# Patient Record
Sex: Male | Born: 1950 | Race: White | Hispanic: No | Marital: Married | Smoking: Former smoker
Health system: Southern US, Community
[De-identification: ages and names within clinical notes are randomized; demographics above are authoritative.]

## PROBLEM LIST (undated history)

## (undated) DIAGNOSIS — F419 Anxiety disorder, unspecified: Secondary | ICD-10-CM

## (undated) DIAGNOSIS — Z8546 Personal history of malignant neoplasm of prostate: Secondary | ICD-10-CM

## (undated) DIAGNOSIS — C801 Malignant (primary) neoplasm, unspecified: Secondary | ICD-10-CM

## (undated) DIAGNOSIS — I10 Essential (primary) hypertension: Secondary | ICD-10-CM

## (undated) DIAGNOSIS — E785 Hyperlipidemia, unspecified: Secondary | ICD-10-CM

## (undated) DIAGNOSIS — N529 Male erectile dysfunction, unspecified: Secondary | ICD-10-CM

## (undated) DIAGNOSIS — R0602 Shortness of breath: Secondary | ICD-10-CM

## (undated) HISTORY — DX: Essential (primary) hypertension: I10

## (undated) HISTORY — DX: Hyperlipidemia, unspecified: E78.5

## (undated) HISTORY — DX: Malignant (primary) neoplasm, unspecified: C80.1

## (undated) HISTORY — PX: OTHER SURGICAL HISTORY: SHX169

## (undated) HISTORY — DX: Male erectile dysfunction, unspecified: N52.9

## (undated) HISTORY — DX: Anxiety disorder, unspecified: F41.9

## (undated) HISTORY — DX: Personal history of malignant neoplasm of prostate: Z85.46

## (undated) HISTORY — PX: HERNIA REPAIR: SHX51

---
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
## 1999-06-08 ENCOUNTER — Encounter: Payer: Self-pay | Admitting: Urology

## 1999-06-08 ENCOUNTER — Ambulatory Visit (HOSPITAL_COMMUNITY): Admission: RE | Admit: 1999-06-08 | Discharge: 1999-06-08 | Payer: Self-pay | Admitting: Urology

## 2002-05-26 ENCOUNTER — Ambulatory Visit (HOSPITAL_COMMUNITY): Admission: RE | Admit: 2002-05-26 | Discharge: 2002-05-26 | Payer: Self-pay | Admitting: Cardiovascular Disease

## 2003-09-03 HISTORY — PX: PROSTATE SURGERY: SHX751

## 2003-11-07 ENCOUNTER — Inpatient Hospital Stay (HOSPITAL_COMMUNITY): Admission: RE | Admit: 2003-11-07 | Discharge: 2003-11-09 | Payer: Self-pay | Admitting: Urology

## 2003-11-07 ENCOUNTER — Encounter (INDEPENDENT_AMBULATORY_CARE_PROVIDER_SITE_OTHER): Payer: Self-pay | Admitting: Specialist

## 2004-02-15 ENCOUNTER — Encounter: Admission: RE | Admit: 2004-02-15 | Discharge: 2004-02-15 | Payer: Self-pay | Admitting: Cardiovascular Disease

## 2004-07-24 ENCOUNTER — Encounter: Admission: RE | Admit: 2004-07-24 | Discharge: 2004-07-24 | Payer: Self-pay | Admitting: Cardiovascular Disease

## 2005-10-03 ENCOUNTER — Ambulatory Visit (HOSPITAL_COMMUNITY): Admission: RE | Admit: 2005-10-03 | Discharge: 2005-10-03 | Payer: Self-pay | Admitting: General Surgery

## 2005-10-10 ENCOUNTER — Ambulatory Visit (HOSPITAL_COMMUNITY): Admission: RE | Admit: 2005-10-10 | Discharge: 2005-10-10 | Payer: Self-pay | Admitting: General Surgery

## 2007-08-11 ENCOUNTER — Ambulatory Visit (HOSPITAL_BASED_OUTPATIENT_CLINIC_OR_DEPARTMENT_OTHER): Admission: RE | Admit: 2007-08-11 | Discharge: 2007-08-11 | Payer: Self-pay | Admitting: Orthopedic Surgery

## 2008-04-20 ENCOUNTER — Ambulatory Visit (HOSPITAL_BASED_OUTPATIENT_CLINIC_OR_DEPARTMENT_OTHER): Admission: RE | Admit: 2008-04-20 | Discharge: 2008-04-20 | Payer: Self-pay | Admitting: Orthopedic Surgery

## 2009-08-21 ENCOUNTER — Encounter: Admission: RE | Admit: 2009-08-21 | Discharge: 2009-08-21 | Payer: Self-pay | Admitting: Cardiovascular Disease

## 2010-08-10 ENCOUNTER — Encounter
Admission: RE | Admit: 2010-08-10 | Discharge: 2010-08-10 | Payer: Self-pay | Source: Home / Self Care | Attending: Cardiovascular Disease | Admitting: Cardiovascular Disease

## 2010-09-22 ENCOUNTER — Encounter: Payer: Self-pay | Admitting: Cardiovascular Disease

## 2010-09-23 ENCOUNTER — Encounter: Payer: Self-pay | Admitting: Cardiovascular Disease

## 2011-01-15 NOTE — Op Note (Signed)
NAME:  James Lang, James Lang NO.:  [id number]   MEDICAL RECORD NO.:  [id number]          PATIENT TYPE:  AMB   LOCATION:  DSC                          FACILITY:  [facility]   PHYSICIAN:  Cindee Salt, M.D.       DATE OF BIRTH:  [birth year]   DATE OF PROCEDURE:  04/20/2008  DATE OF DISCHARGE:                               OPERATIVE REPORT   PREOPERATIVE DIAGNOSIS:  Carpal tunnel syndrome, right hand.   POSTOPERATIVE DIAGNOSIS:  Carpal tunnel syndrome, right hand.   OPERATION:  Decompression, right median nerve.   SURGEON:  Cindee Salt, MD   ASSISTANT:  Joaquin Courts, RN   ANESTHESIA:  Forearm based IV regional.   ANESTHESIOLOGIST:  Janetta Hora. Gelene Mink, MD   HISTORY:  The patient is a 60 year old male with a history of carpal  tunnel syndrome, EMG nerve conductions positive.  This has not responded  to conservative treatment.  He has undergone release of his left side.  He is admitted now for release of the right.  He is aware that there is  no guarantee with surgery, possibility of infection, recurrence injury  to arteries, nerves, tendons, incomplete relief of his symptoms, or  dystrophy.  In the preoperative area the patient is seen.  The extremity  marked by both the patient and surgeon.  Antibiotic given.  Questions  again encouraged and answered.   PROCEDURE:  The patient was brought to the operating room where a  forearm based IV regional anesthetic was carried out under the direction  of Dr. Gelene Mink.  He was prepped using DuraPrep, supine position, right  arm free.  A time-out was taken.  A longitudinal incision was made in  the palm and carried down through the subcutaneous tissue.  Bleeders  were electrocauterized.  Palmar fascia was split.  Superficial palmar  arch identified.  The flexor tendon to the ring little finger identified  to the ulnar side of the median nerve.  The carpal retinaculum was  incised with sharp dissection.  Right angle and Sewall  retractor were  placed between skin and forearm fascia.  Fascia was released for  approximately a centimeter and half proximal to the wrist crease under  direct vision.  Canal was explored.  The area of compression to the  nerve was apparent with an area of hyperemia.  No further lesions were  identified.  Tenosynovium tissue was moderately thickened.  The wound  was irrigated.  Skin closed with interrupted  5-0 Vicryl Rapide sutures.  Sterile compressive dressing and splint to  the wrist with the fingers free was applied.  The patient tolerated the  procedure well and was taken to the recovery room for observation in  satisfactory condition.  He will be discharged to home to return to the  [facility] [place] in 1 week on Talwin NX.           ______________________________  Cindee Salt, M.D.     GK/MEDQ  D:  04/20/2008  T:  04/21/2008  Job:  [id number]   cc:   Bevelyn Buckles. Nash Shearer, M.D.

## 2011-01-15 NOTE — Op Note (Signed)
NAME:  James Lang, James Lang NO.:  [id number]   MEDICAL RECORD NO.:  [id number]          PATIENT TYPE:  AMB   LOCATION:  DSC                          FACILITY:  [facility]   PHYSICIAN:  Cindee Salt, M.D.       DATE OF BIRTH:  05/30/1951   DATE OF PROCEDURE:  08/11/2007  DATE OF DISCHARGE:                               OPERATIVE REPORT   PREOPERATIVE DIAGNOSIS:  Carpal tunnel syndrome, left hand.   POSTOPERATIVE DIAGNOSIS:  Carpal tunnel syndrome, left hand.   PROCEDURE:  Decompression of left median nerve.   SURGEON:  Cindee Salt, M.D.   ASSISTANT:  R.N.   ANESTHESIA:  Forearm-based IV regional.   INDICATIONS FOR PROCEDURE:  The patient is a 60 year old male with a  history of carpal tunnel syndrome, EMG nerve conductions positive, which  has not responded to conservative treatment.  He has elected to proceed  to have this done.  He is aware of the risks and complications,  including infection, recurrence, injury to arteries, nerves, tendons,  incomplete relief of symptoms and dystrophy.  In the preoperative area,  the patient is seen, questions encouraged and answered.  Antibiotic  given.   DESCRIPTION OF PROCEDURE:  The patient was brought to the operating room  where a forearm-based IV regional anesthetic was carried out without  difficulty.  He was prepped using DuraPrep in the supine position with  the left arm free.   A longitudinal incision was made in the palm and carried down through  the subcutaneous tissue.  Bleeders were electrocauterized.  The palmar  fascia was split.  The superficial palmar arch was identified.  The  flexor tendon to the ring and little finger identified.  To the ulnar  side of the median nerve, the carpal retinaculum was incised with sharp  dissection.  Right-angle and Sewell retractor were placed between the  skin and forearm fascia and the fascia released for approximately 1.5 cm  proximal to the wrist crease under direct vision.   The area of  compression of the nerve was immediately apparent.  Right-angle and  Sewell retractor were placed between the skin and forearm fascia and the  fascia released for approximately 1.5 proximal to the wrist crease under  direct vision.  The canal was explored.  Again, no further lesions were  identified other than the area of compression to the nerve.  The wound  was irrigated.  The skin was closed with interrupted 4-0 Vicryl Rapide  suture, and a sterile compressive dressing and splint to the wrist  applied.   The patient tolerated the procedure well.  He was taken to the recovery  room for observation in satisfactory condition.  He will be discharged  home to return to the [facility] [place] in 1 week on Talwin Nx.           ______________________________  Cindee Salt, M.D.     GK/MEDQ  D:  08/11/2007  T:  08/11/2007  Job:  [id number]   cc:   Derenda Mis of [place]

## 2011-01-18 NOTE — Op Note (Signed)
NAME:  James Lang, James Lang NO.:  [id number]   MEDICAL RECORD NO.:  [id number]                   PATIENT TYPE:  INP   LOCATION:  X003                                 FACILITY:  [facility]   PHYSICIAN:  Maretta Bees. Vonita Moss, M.D.             DATE OF BIRTH:  [birth year]   DATE OF PROCEDURE:  11/07/2003  DATE OF DISCHARGE:                                 OPERATIVE REPORT   PREOPERATIVE DIAGNOSIS:  Prostatic carcinoma.   POSTOPERATIVE DIAGNOSIS:  Prostatic carcinoma.   PROCEDURE:  Radical retropubic prostatectomy and bilateral pelvic lymph node  dissection.   SURGEON:  Maretta Bees. Vonita Moss, M.D.   ASSISTANT:  Veverly Fells. Vernie Ammons, M.D.   ANESTHESIA:  General.   INDICATIONS:  This 60 year old white male with a PSA of 8.93 underwent a  prostate biopsy that showed Gleason grade 6 (3+3) in 50% of the tissue  biopsied on the right lobe, and the left lobe was benign.  He was counseled  about the therapeutic options as noted in my H&P, and he opted for surgery.   DESCRIPTION OF PROCEDURE:  The patient was brought to the operating room and  placed in the supine position, the lower abdomen and external genitalia  prepped and draped in the usual fashion.  A #24 French 30 mL Foley was  inserted without difficulty.  A lower midline vertical incision was made  below the umbilicus to the symphysis pubis and the rectus fascia and muscle  divided in the midline.  The pelvic retroperitoneal space was dissected out  quite easily.  The obturator nerves on each side were easily identified.  The right obturator lymph node packet was dissected out and hemostasis and  lymphostasis obtained with the use of black silk ties and Hemoloc clips.  Great vessels and obturator nerve were intact after removal of this packet  and also was similarly performed on the left obturator lymph node packet.  Both specimens were sent for permanent section.  The endopelvic fascia was  easily taken down  bilaterally.  It was quite delicate and easy to perforate.  The top of the prostate was made hemostatic with a 2-0 chromic catgut suture  ligature.  The puboprostatic ligaments were taken down bilaterally.  The  dorsal vein complex was easily identified and a McDougal clamp placed around  it and two heavy Vicryl ties placed on the dorsal vein complex.  The Stamey  retractor was used to isolate the dorsal vein complex and the urethra and  protect the apex of the prostate with a McDougal clamp behind the dorsal  vein complex.  It was divided and one small bleeder was oversewn with a 2-0  Vicryl suture in the dorsal vein complex.  A scissors was used to separate  the neurovascular bundle on each side of the urethra and the dorsal urethra  opened up with a knife blade and a right angle clamp placed around the back  of the urethra, which was then divided and the Foley catheter pulled up, and  the prostate was easily dissected off the rectum at this point.  The  prostatic pedicles were divided and controlled with Hemoloc clips  bilaterally.  The dorsal aspect of the seminal vesicles were easily  dissected out.  The anterior bladder neck was opened and there was a small  median lobe very close to the ureteral orifices, and the mucosa was scored  in the median lobe and the prostate dissected away from the bladder neck and  a nice plane between the bladder and the seminal vesicles was found and  developed.  The vas deferens on each side were isolated and divided after  placing on the Hemoloc clip, as were the seminal vesicles, and the specimen  was removed intact.  The bladder neck mucosa was reapproximated to the  surrounding bladder neck musculature using interrupted 4-0 chromic catgut.  The posterior bladder neck was closed in running fashion with a 2-0 chromic  catgut, taking precise care to protect the ureteral orifices, which were  closed to the bladder neck, but we were able to do this and  after this  closure noted indigo carmine-stained urine coming from each ureteral  orifice.  At this point there was a very nicely everted and well-defined  bladder neck.  A 20 French 5 mL Foley was placed per urethra and a #1  Prolene was tied through the tip of the Foley catheter, which would later be  brought out through the anterior wall of the bladder and through the  abdominal wall and sewed in a button at skin level at the end of the case.  The bladder neck and urethral anastomosis was performed by placing 2-0  Vicryl at 2, 5, 7, 10, and 12 o'clock and the sutures were tied down,  creating a nice watertight anastomosis with blue-tinged irrigation with an  Asepto syringe.  A stab wound was made in the left __________ incision,  through which a large flat Blake drain was placed in the prevesical space.  At this point the wound was irrigated with an antibiotic solution.  The  rectus muscle was reapproximated with running 0 chromic catgut.  The rectus  fascia was closed with running #1 PDS.  The subcu was irrigated and the skin  closed with skin staples.  The Blake drain was connected to closed bulb  drainage and sewn in place with a black silk.  The wound was cleaned with  dry sterile gauze dressings.  Sponge, needle, instrument count was correct.  Estimated blood loss was 400 mL.  He was taken to the recovery room in good  condition, having tolerated the procedure well.                                               Maretta Bees. Vonita Moss, M.D.    LJP/MEDQ  D:  11/07/2003  T:  11/07/2003  Job:  [id number]   cc:   Ricki Rodriguez, M.D.  [address]  [place] [address]

## 2011-01-18 NOTE — H&P (Signed)
NAME:  James Lang, LAUNER NO.:  [id number]   MEDICAL RECORD NO.:  [id number]                   PATIENT TYPE:  INP   LOCATION:  X003                                 FACILITY:  [facility]   PHYSICIAN:  Maretta Bees. Vonita Moss, M.D.             DATE OF BIRTH:  03/11/1951   DATE OF ADMISSION:  11/07/2003  DATE OF DISCHARGE:                                HISTORY & PHYSICAL   This 60 year old white male was seen by me on August 23, 2003, with a PSA  of 8.93 obtained on routine examination. He had some mild bladder outlet  obstructive symptoms, but nothing that required therapy. He had no dysuria,  hematuria, or history of prostatitis. Repeat PSA was still elevated, so he  had a prostate biopsy performed on September 20 2003, that showed Gleason 6  (3+3) carcinoma involving 50% of the right lobe and some focal atrophy, but  no malignancy seen on the left side.  He and his wife were counseled about  therapeutic options and he elected to have radical retropubic prostatectomy  with the known risks of impotence, incontinence, hemorrhage, rectal injury,  and the usual medical complications that are fortunately not common. He is  admitted today for surgery.   PAST MEDICAL HISTORY:  1. History of hypertension.  2. Hypercholesterolemia.  3. COPD.  4. Anxiety.   MEDICATIONS:  1. Coreg 3.125 mg b.i.d.  2. Sular 10 mg each morning.  3. Lipitor 10 mg at bedtime.  4. Alprazolam 0.25 mg p.r.n. h.s.  5. Multivitamins.  6. Advil p.r.n. which she has not taken for a week.   ALLERGIES:  Cannot tolerate CODEINE due to nausea and vomiting.   PAST SURGICAL HISTORY:  Includes only surgery on the right foot.   SOCIAL HISTORY:  He quit smoking in 2003 and does not drink alcohol.   FAMILY HISTORY:  Negative for prostate cancer.   REVIEW OF SYSTEMS:  As noted on the health history form.   PHYSICAL EXAMINATION:  VITAL SIGNS: Blood pressure 148/82, pulse 60, and  temperature 97.  GENERAL: He is alert and oriented.  SKIN: Warm and dry.  NECK: Supple.  CHEST: Clear.  HEART: A faint systolic murmur.  ABDOMEN: Soft and nontender.  GENITALIA: External genitalia unremarkable. Prostate feels benign and  smooth.   IMPRESSION:  1. Prostatic carcinoma.  2. Chronic obstructive pulmonary disease.  3. Hypertension.  4. Hypercholesterolemia.  5. Chronic anxiety.                                               Maretta Bees. Vonita Moss, M.D.    LJP/MEDQ  D:  11/07/2003  T:  11/07/2003  Job:  [id number]   cc:   Ricki Rodriguez, M.D.  [address]  [place] [address]

## 2011-01-18 NOTE — Discharge Summary (Signed)
NAME:  James Lang, James Lang NO.:  [id number]   MEDICAL RECORD NO.:  [id number]                   PATIENT TYPE:  INP   LOCATION:  0343                                 FACILITY:  [facility]   PHYSICIAN:  Maretta Bees. Vonita Moss, M.D.             DATE OF BIRTH:  06-Feb-1951   DATE OF ADMISSION:  11/07/2003  DATE OF DISCHARGE:  11/09/2003                                 DISCHARGE SUMMARY   FINAL DIAGNOSES:  1. Prosthetic carcinoma.  2. Hypertension.  3. Hypercholesterolemia.  4. Chronic obstructive pulmonary disease.  5. Chronic anxiety.   PROCEDURE:  Radical retropubic prostatectomy and bilateral pelvic lymph node  dissections November 07, 2003.   HISTORY OF PRESENT ILLNESS:  This 60 year old white male was found to have a  PSA of 8.93 on routine examination and prostate biopsy demonstrated Gleason  grade 6 carcinoma involving 50% of the right lobe and some focal atrophy but  no malignancy on the left side. He was counseled about therapies and opted  for surgery. He was cleared for surgery by Dr. Algie Coffer.   PHYSICAL EXAMINATION:  Noncontributory.   HOSPITAL COURSE:  After admission he underwent radical retropubic  prostatectomy and bilateral pelvic lymph node dissection. His postoperative  course was quite benign and he never developed any significant fever or any  heavy wound drainage. He had good urinary output. He passed flatus and was  eating solid food by the morning of discharge. His pain was under good  control and he was ambulating well. His Blake drain was removed that  morning. He was sent home on limited activity and his Foley catheter will be  connected to a leg bag during the day and a drainage bag at night.   FOLLOW UP:  Return to the office next week for followup. Final pathology is  pending.   CONDITION ON DISCHARGE:  Good.   DISCHARGE MEDICATIONS:  He was sent home on Vicodin for pain. May continue  his usual medications of Coreg 3.125 mg b.i.d.,  Sular 10 mg each morning,  Lipitor 10 mg at bedtime, Alprazolam 0.25 mg at bedtime p.r.n. and  multivitamins.                                               Maretta Bees. Vonita Moss, M.D.    LJP/MEDQ  D:  11/09/2003  T:  11/10/2003  Job:  [id number]

## 2011-01-18 NOTE — Op Note (Signed)
NAME:  James Lang, James Lang NO.:  [id number]   MEDICAL RECORD NO.:  [id number]          PATIENT TYPE:  AMB   LOCATION:  DAY                          FACILITY:  [facility]   PHYSICIAN:  Ollen Gross. Vernell Morgans, M.D. DATE OF BIRTH:  1951/04/19   DATE OF PROCEDURE:  10/10/2005  DATE OF DISCHARGE:                                 OPERATIVE REPORT   PREOPERATIVE DIAGNOSIS:  Anal fistula.   POSTOP DIAGNOSIS:  Anal fistula.   PROCEDURE:  Exam under anesthesia and anal fistulotomy.   SURGEON:  Ollen Gross. Carolynne Edouard, M.D.   ANESTHESIA:  General via LMA.   DESCRIPTION OF PROCEDURE:  After informed consent was obtained, the patient  was brought to the operating room and placed in the supine position on the  operating room table. After induction of general anesthesia, the patient was  placed in the lithotomy position and his perirectal area was prepped with  Betadine and draped in the usual sterile fashion. The perirectal region was  then infiltrated 1/4% Marcaine with epinephrine. A finger was then inserted  into the rectum and no masses were appreciable.   Next, a bullet retractor was placed inside the rectum posteriorly. There was  noted to be some pus coming from the skin as well as coming from an opening  near the dentate line. This tract was probed with a small silver probe; and  the two areas communicated easily. This area was noted to be shallow and  superficial to the sphincter muscles. It was then opened sharply with the  electrocautery; and the granulation tissue in the fistula tract was  fulgurated, also with electrocautery. No other abnormalities were noted. The  area was then dressed with Dibucaine ointment and sterile dressings. The  patient tolerated the procedure well. At that end of the case, all needle,  sponge, and instrument counts were correct. The patient was then awakened,  taken to the recovery room in stable condition.      Ollen Gross. Vernell Morgans, M.D.  Electronically  Signed     PST/MEDQ  D:  10/10/2005  T:  10/11/2005  Job:  [id number]

## 2011-06-10 LAB — POCT HEMOGLOBIN-HEMACUE
Hemoglobin: 15.8
Operator id: 128471

## 2011-06-10 LAB — BASIC METABOLIC PANEL
CO2: 29
Calcium: 9.1
Chloride: 104
GFR calc non Af Amer: >60
Glucose, Bld: 107 — ABNORMAL HIGH
Potassium: 4.8
Sodium: 139

## 2011-11-17 ENCOUNTER — Ambulatory Visit (INDEPENDENT_AMBULATORY_CARE_PROVIDER_SITE_OTHER): Payer: 59 | Admitting: Family Medicine

## 2011-11-17 VITALS — BP 107/71 | HR 51 | Temp 98.2°F | Resp 16 | Ht 71.5 in | Wt 242.0 lb

## 2011-11-17 DIAGNOSIS — J189 Pneumonia, unspecified organism: Secondary | ICD-10-CM

## 2011-11-17 DIAGNOSIS — I10 Essential (primary) hypertension: Secondary | ICD-10-CM

## 2011-11-17 MED ORDER — CEFTRIAXONE SODIUM 1 G IJ SOLR
1.0000 g | INTRAMUSCULAR | Status: DC
  Administered 2011-11-17: 1 g via INTRAMUSCULAR

## 2011-11-17 MED ORDER — LEVOFLOXACIN 500 MG PO TABS: 500.0000 mg | ORAL_TABLET | Freq: Every day | ORAL | Status: AC

## 2011-11-17 MED ORDER — HYDROCODONE-HOMATROPINE 5-1.5 MG/5ML PO SYRP: 5.0000 mL | ORAL_SOLUTION | Freq: Three times a day (TID) | ORAL | Status: DC | PRN

## 2011-11-17 NOTE — Patient Instructions (Signed)

## 2011-11-17 NOTE — Progress Notes (Signed)
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  Ex-smoker (quit 8 years ago).  Occas productive cough. He is accompanied by his wife today. He notes that he gets a cough requiring medical attention at lease once a year.  O:  Acute distress, friendly and cooperative Skin warm and dry HEENT unremarkable Chest right sided rales, no resp difficulty  A:  Pneumonitis, acute, progressive.  No significant resp compromise now  P:  Levaquin 500 mg qd x 7 Hydromet

## 2011-11-19 ENCOUNTER — Telehealth: Payer: Self-pay

## 2011-11-19 NOTE — Telephone Encounter (Signed)
Discussed with Dr. Milus Glazier.  He asks that the patient come in to see him tomorrow for re-evaluation.

## 2011-11-19 NOTE — Telephone Encounter (Signed)
.  [facility]     PT TOLD TO CALL BACK IF SYMPTOMS DID NOT IMPROVE,HE DOES NOT FEEL MUCH BETTER,PLEASE ADVISE.   BEST PHONE  [phone]  [facility] [address]

## 2011-11-19 NOTE — Telephone Encounter (Signed)
Spoke to patient's wife. Patient was already in bed. She will have him here tomorrow.

## 2011-11-19 NOTE — Telephone Encounter (Signed)
Dr. Milus Glazier, would you like the patient to return, or did you have a plan to call in additional treatment?

## 2011-11-19 NOTE — Telephone Encounter (Signed)
Spoke to pt, still has cough.  Keeping H/A due to cough.  Cough has not improved, but pt did state cough med is working.  Pt was told to c/b in 3 days if not better.  Please avise.

## 2011-11-20 ENCOUNTER — Ambulatory Visit: Payer: 59

## 2011-11-20 ENCOUNTER — Ambulatory Visit (INDEPENDENT_AMBULATORY_CARE_PROVIDER_SITE_OTHER): Payer: 59 | Admitting: Family Medicine

## 2011-11-20 VITALS — BP 125/77 | HR 62 | Temp 98.5°F | Resp 18 | Ht 71.0 in | Wt 239.6 lb

## 2011-11-20 DIAGNOSIS — R062 Wheezing: Secondary | ICD-10-CM

## 2011-11-20 DIAGNOSIS — R059 Cough, unspecified: Secondary | ICD-10-CM

## 2011-11-20 DIAGNOSIS — R05 Cough: Secondary | ICD-10-CM

## 2011-11-20 LAB — POCT CBC
Granulocyte percent: 67.2 %G (ref 37–80)
HCT, POC: 47.8 % (ref 43.5–53.7)
Hemoglobin: 15.6 g/dL (ref 14.1–18.1)
Lymph, poc: 1.8 (ref 0.6–3.4)
MCH, POC: 29.2 pg (ref 27–31.2)
MCHC: 32.6 g/dL (ref 31.8–35.4)
MCV: 89.3 fL (ref 80–97)
MID (cbc): 0.7 (ref 0–0.9)
MPV: 9.6 fL (ref 0–99.8)
POC Granulocyte: 5 (ref 2–6.9)
POC LYMPH PERCENT: 23.8 %L (ref 10–50)
POC MID %: 9 %M (ref 0–12)
Platelet Count, POC: 340 10*3/uL (ref 142–424)
RBC: 5.35 M/uL (ref 4.69–6.13)
RDW, POC: 13.1 %
WBC: 7.5 10*3/uL (ref 4.6–10.2)

## 2011-11-20 MED ORDER — ALBUTEROL SULFATE (2.5 MG/3ML) 0.083% IN NEBU
2.5000 mg | INHALATION_SOLUTION | Freq: Once | RESPIRATORY_TRACT | Status: AC
  Administered 2011-11-20: 2.5 mg via RESPIRATORY_TRACT

## 2011-11-20 MED ORDER — METHYLPREDNISOLONE 4 MG PO KIT: PACK | ORAL | Status: AC

## 2011-11-20 MED ORDER — MOMETASONE FURO-FORMOTEROL FUM 200-5 MCG/ACT IN AERO: 2.0000 | INHALATION_SPRAY | Freq: Two times a day (BID) | RESPIRATORY_TRACT | Status: DC

## 2011-11-20 NOTE — Progress Notes (Signed)
  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  He has a h/o bronchitis.  Felt better at first this morning but then the congestion and wheezing began.  No dyspnea on exertion.  Cough is minimally productive.    O:  HEENT: nasal congestion, otherwise neg NAD, but has audible wheezing Chest:  Bibasilar rales and diffuse wheezes. Neck: some fullness, no JVD Ext: no significant edema Improves after inhaler  [facility] reading (PRIMARY) by  Dr. Milus Glazier: CXR. No infiltrate or suspicious nodules  A: severe bronchitis  P:  Pt to let me know if sx do not clear in 48 hours. Medrol dospak dulera 200 two puffs bid.

## 2011-11-22 ENCOUNTER — Telehealth: Payer: Self-pay

## 2011-11-22 NOTE — Telephone Encounter (Signed)
PT STATES HE WAS SEEN BY DR Kenyon Ana AND ISN'T GETTING ANY BETTER. PLEASE CALL [phone]   [facility] [address]

## 2011-11-24 MED ORDER — HYDROCODONE-HOMATROPINE 5-1.5 MG/5ML PO SYRP: 5.0000 mL | ORAL_SOLUTION | Freq: Three times a day (TID) | ORAL | Status: AC | PRN

## 2011-11-24 NOTE — Telephone Encounter (Signed)
Advised pt that rx sent to pharmacy 

## 2011-11-24 NOTE — Telephone Encounter (Signed)
Dr. Milus Glazier your note said for the pt to let you know if he was not better but there were no instructions for me, so I am sending to you.

## 2011-11-24 NOTE — Telephone Encounter (Signed)
Please refill cough med. thx

## 2011-11-24 NOTE — Telephone Encounter (Signed)
PT STATES THAT HE IS ONLY A LITTLE BETTER AND IS STILL COUGHING.  HE HAS FINISHED THE COUGH MEDICINE.  PT STATES IN THE PAST WHEN HE HAD PNEUMONIA HE WAS GIVEN A ZPAK. CAN WE GET HIM SOME MORE COUGH MED? ZPAK? PLEASE ADVISE

## 2012-01-30 ENCOUNTER — Other Ambulatory Visit: Payer: Self-pay | Admitting: Cardiovascular Disease

## 2012-01-30 ENCOUNTER — Ambulatory Visit
Admission: RE | Admit: 2012-01-30 | Discharge: 2012-01-30 | Disposition: A | Discharge status: Home / Self Care | Payer: 59 | Source: Ambulatory Visit | Attending: Cardiovascular Disease | Admitting: Cardiovascular Disease

## 2012-01-30 DIAGNOSIS — R52 Pain, unspecified: Secondary | ICD-10-CM

## 2013-01-29 ENCOUNTER — Other Ambulatory Visit: Payer: Self-pay | Admitting: Gastroenterology

## 2013-03-03 ENCOUNTER — Encounter (INDEPENDENT_AMBULATORY_CARE_PROVIDER_SITE_OTHER): Payer: Self-pay | Admitting: Surgery

## 2013-03-08 ENCOUNTER — Ambulatory Visit (INDEPENDENT_AMBULATORY_CARE_PROVIDER_SITE_OTHER): Payer: BC Managed Care – PPO | Admitting: Surgery

## 2013-03-08 ENCOUNTER — Encounter (INDEPENDENT_AMBULATORY_CARE_PROVIDER_SITE_OTHER): Payer: Self-pay | Admitting: Surgery

## 2013-03-08 VITALS — BP 128/84 | HR 69 | Temp 97.2°F | Resp 18 | Ht 73.5 in | Wt 234.6 lb

## 2013-03-08 DIAGNOSIS — K4090 Unilateral inguinal hernia, without obstruction or gangrene, not specified as recurrent: Secondary | ICD-10-CM

## 2013-03-08 NOTE — Progress Notes (Signed)
Patient ID: James Lang, male   DOB: 1950-09-12, 62 y.o.   MRN: [id number]  Chief Complaint  Patient presents with  . New Evaluation    evla ing hernia/extending into appendix    HPI James Lang is a 62 y.o. male.   HPI This is a pleasant gentleman referred by Dr. Mena Goes for evaluation of right groin pain. He is followed closely first prostate cancer and having pain in his right groin. A CAT scan was performed which confirmed a right inguinal hernia. He is noticed mostly burning discomfort and only a slight bulge. He has had no obstructive symptoms. He denies nausea or vomiting. The pain is moderate in intensity. It does not refer any where else. Past Medical History  Diagnosis Date  . Anxiety   . Hyperlipidemia   . Hypertension   . Impotence of organic origin   . Personal history of malignant neoplasm of prostate   . Cancer     Past Surgical History  Procedure Laterality Date  . Carpel tunnel    . Foot surgery  1970    Toes shot off.    History reviewed. No pertinent family history.  Social History History  Substance Use Topics  . Smoking status: Former Smoker    Types: Cigarettes    Quit date: 05/20/2003  . Smokeless tobacco: Never Used  . Alcohol Use: 7.2 oz/week    12 Cans of beer per week     Comment: Weekly.    Allergies  Allergen Reactions  . Codeine Nausea Only    Current Outpatient Prescriptions  Medication Sig Dispense Refill  . ALPRAZolam (XANAX) 0.5 MG tablet Take 0.5 mg by mouth as needed.      Marland Kitchen amLODipine (NORVASC) 10 MG tablet Take 10 mg by mouth daily.      . ramipril (ALTACE) 10 MG capsule Take 10 mg by mouth daily.      . rosuvastatin (CRESTOR) 10 MG tablet Take 10 mg by mouth daily.      . traMADol (ULTRAM) 50 MG tablet        Current Facility-Administered Medications  Medication Dose Route Frequency Provider Last Rate Last Dose  . cefTRIAXone (ROCEPHIN) injection 1 g  1 g Intramuscular Q24H Elvina Sidle, MD   1 g at 11/17/11  1311    Review of Systems Review of Systems  Constitutional: Negative for fever, chills and unexpected weight change.  HENT: Negative for hearing loss, congestion, sore throat, trouble swallowing and voice change.   Eyes: Negative for visual disturbance.  Respiratory: Negative for cough and wheezing.   Cardiovascular: Negative for chest pain, palpitations and leg swelling.  Gastrointestinal: Negative for nausea, vomiting, abdominal pain, diarrhea, constipation, blood in stool, abdominal distention, anal bleeding and rectal pain.  Genitourinary: Negative for hematuria and difficulty urinating.  Musculoskeletal: Negative for arthralgias.  Skin: Negative for rash and wound.  Neurological: Negative for seizures, syncope, weakness and headaches.  Hematological: Negative for adenopathy. Does not bruise/bleed easily.  Psychiatric/Behavioral: Negative for confusion.    Blood pressure 128/84, pulse 69, temperature 97.2 F (36.2 C), temperature source Temporal, resp. rate 18, height 6' 1.5" (1.867 m), weight 234 lb 9.6 oz (106.414 kg).  Physical Exam Physical Exam  Constitutional: He is oriented to person, place, and time. He appears well-developed and well-nourished. No distress.  HENT:  Head: Normocephalic and atraumatic.  Right Ear: External ear normal.  Left Ear: External ear normal.  Nose: Nose normal.  Mouth/Throat: Oropharynx is clear and moist. No  oropharyngeal exudate.  Eyes: Conjunctivae are normal. Pupils are equal, round, and reactive to light. Right eye exhibits no discharge. Left eye exhibits no discharge. No scleral icterus.  Neck: Normal range of motion. Neck supple. No tracheal deviation present. No thyromegaly present.  Cardiovascular: Normal rate, regular rhythm, normal heart sounds and intact distal pulses.   No murmur heard. Pulmonary/Chest: Effort normal and breath sounds normal. No respiratory distress. He has no wheezes.  Abdominal: Soft. Bowel sounds are normal. He  exhibits no distension. There is no tenderness. There is no rebound.  There is a very small easily reducible right inguinal hernia. There is no evidence of left inguinal hernia or umbilical hernia  Musculoskeletal: Normal range of motion.  Lymphadenopathy:    He has no cervical adenopathy.  Neurological: He is alert and oriented to person, place, and time.  Skin: Skin is warm and dry. No rash noted. No erythema.  Psychiatric: His behavior is normal. Judgment normal.    Data Reviewed I have reviewed the CAT scan demonstrating a small right inguinal hernia containing the tip of the appendix  Assessment    Right inguinal hernia     Plan    Given his symptoms, repair with mesh was recommended. Because of his prostate surgery, I will do this open. I discussed the risk of surgery which includes but is not limited to bleeding, infection, injury to Surrounding structures, nerve entrapment, chronic pain, recurrence, etc. He understands and wishes to proceed. Surgery will be scheduled        Ferdie Bakken A 03/08/2013, 2:05 PM

## 2013-03-09 ENCOUNTER — Encounter (HOSPITAL_COMMUNITY): Payer: Self-pay | Admitting: Pharmacy Technician

## 2013-03-10 ENCOUNTER — Encounter (HOSPITAL_COMMUNITY): Payer: Self-pay | Admitting: *Deleted

## 2013-03-10 MED ORDER — CEFAZOLIN SODIUM-DEXTROSE 2-3 GM-% IV SOLR
2.0000 g | INTRAVENOUS | Status: AC
  Administered 2013-03-11: 2 g via INTRAVENOUS
  Filled 2013-03-10: qty 50

## 2013-03-10 NOTE — H&P (Signed)
Patient ID: James Lang, male DOB: March 15, 1951, 62 y.o. MRN: [id number]  Chief Complaint   Patient presents with   .  New Evaluation     evla ing hernia/extending into appendix   HPI  James Lang is a 62 y.o. male.  HPI  This is a pleasant gentleman referred by Dr. Mena Goes for evaluation of right groin pain. He is followed closely first prostate cancer and having pain in his right groin. A CAT scan was performed which confirmed a right inguinal hernia. He is noticed mostly burning discomfort and only a slight bulge. He has had no obstructive symptoms. He denies nausea or vomiting. The pain is moderate in intensity. It does not refer any where else.  Past Medical History   Diagnosis  Date   .  Anxiety    .  Hyperlipidemia    .  Hypertension    .  Impotence of organic origin    .  Personal history of malignant neoplasm of prostate    .  Cancer     Past Surgical History   Procedure  Laterality  Date   .  Carpel tunnel     .  Foot surgery   1970     Toes shot off.   History reviewed. No pertinent family history.  Social History  History   Substance Use Topics   .  Smoking status:  Former Smoker     Types:  Cigarettes     Quit date:  05/20/2003   .  Smokeless tobacco:  Never Used   .  Alcohol Use:  7.2 oz/week     12 Cans of beer per week      Comment: Weekly.    Allergies   Allergen  Reactions   .  Codeine  Nausea Only    Current Outpatient Prescriptions   Medication  Sig  Dispense  Refill   .  ALPRAZolam (XANAX) 0.5 MG tablet  Take 0.5 mg by mouth as needed.     Marland Kitchen  amLODipine (NORVASC) 10 MG tablet  Take 10 mg by mouth daily.     .  ramipril (ALTACE) 10 MG capsule  Take 10 mg by mouth daily.     .  rosuvastatin (CRESTOR) 10 MG tablet  Take 10 mg by mouth daily.     .  traMADol (ULTRAM) 50 MG tablet       Current Facility-Administered Medications   Medication  Dose  Route  Frequency  Provider  Last Rate  Last Dose   .  cefTRIAXone (ROCEPHIN) injection 1 g  1 g   Intramuscular  Q24H  Elvina Sidle, MD   1 g at 11/17/11 1311   Review of Systems  Review of Systems  Constitutional: Negative for fever, chills and unexpected weight change.  HENT: Negative for hearing loss, congestion, sore throat, trouble swallowing and voice change.  Eyes: Negative for visual disturbance.  Respiratory: Negative for cough and wheezing.  Cardiovascular: Negative for chest pain, palpitations and leg swelling.  Gastrointestinal: Negative for nausea, vomiting, abdominal pain, diarrhea, constipation, blood in stool, abdominal distention, anal bleeding and rectal pain.  Genitourinary: Negative for hematuria and difficulty urinating.  Musculoskeletal: Negative for arthralgias.  Skin: Negative for rash and wound.  Neurological: Negative for seizures, syncope, weakness and headaches.  Hematological: Negative for adenopathy. Does not bruise/bleed easily.  Psychiatric/Behavioral: Negative for confusion.  Blood pressure 128/84, pulse 69, temperature 97.2 F (36.2 C), temperature source Temporal, resp. rate 18, height 6' 1.5" (  1.867 m), weight 234 lb 9.6 oz (106.414 kg).  Physical Exam  Physical Exam  Constitutional: He is oriented to person, place, and time. He appears well-developed and well-nourished. No distress.  HENT:  Head: Normocephalic and atraumatic.  Right Ear: External ear normal.  Left Ear: External ear normal.  Nose: Nose normal.  Mouth/Throat: Oropharynx is clear and moist. No oropharyngeal exudate.  Eyes: Conjunctivae are normal. Pupils are equal, round, and reactive to light. Right eye exhibits no discharge. Left eye exhibits no discharge. No scleral icterus.  Neck: Normal range of motion. Neck supple. No tracheal deviation present. No thyromegaly present.  Cardiovascular: Normal rate, regular rhythm, normal heart sounds and intact distal pulses.  No murmur heard.  Pulmonary/Chest: Effort normal and breath sounds normal. No respiratory distress. He has no  wheezes.  Abdominal: Soft. Bowel sounds are normal. He exhibits no distension. There is no tenderness. There is no rebound.  There is a very small easily reducible right inguinal hernia. There is no evidence of left inguinal hernia or umbilical hernia  Musculoskeletal: Normal range of motion.  Lymphadenopathy:  He has no cervical adenopathy.  Neurological: He is alert and oriented to person, place, and time.  Skin: Skin is warm and dry. No rash noted. No erythema.  Psychiatric: His behavior is normal. Judgment normal.  Data Reviewed  I have reviewed the CAT scan demonstrating a small right inguinal hernia containing the tip of the appendix  Assessment  Right inguinal hernia  Plan  Given his symptoms, repair with mesh was recommended. Because of his prostate surgery, I will do this open. I discussed the risk of surgery which includes but is not limited to bleeding, infection, injury to Surrounding structures, nerve entrapment, chronic pain, recurrence, etc. He understands and wishes to proceed. Surgery will be scheduled

## 2013-03-10 NOTE — Progress Notes (Signed)
03/10/13 1110  OBSTRUCTIVE SLEEP APNEA  Have you ever been diagnosed with sleep apnea through a sleep study? No  Do you snore loudly (loud enough to be heard through closed doors)?  1  Do you often feel tired, fatigued, or sleepy during the daytime? 0  Has anyone observed you stop breathing during your sleep? 0  Do you have, or are you being treated for high blood pressure? 1  BMI more than 35 kg/m2? 0  Age over 61 years old? 1  Neck circumference greater than 40 cm/18 inches? (17)  Gender: 1  Obstructive Sleep Apnea Score 4  Score 4 or greater  Results sent to PCP

## 2013-03-11 ENCOUNTER — Ambulatory Visit (HOSPITAL_COMMUNITY)
Admission: RE | Admit: 2013-03-11 | Discharge: 2013-03-11 | Disposition: A | Discharge status: Home / Self Care | Payer: BC Managed Care – PPO | Source: Ambulatory Visit | Attending: Surgery | Admitting: Surgery

## 2013-03-11 ENCOUNTER — Encounter (HOSPITAL_COMMUNITY): Payer: Self-pay | Admitting: *Deleted

## 2013-03-11 ENCOUNTER — Ambulatory Visit (HOSPITAL_COMMUNITY): Payer: BC Managed Care – PPO | Admitting: Anesthesiology

## 2013-03-11 ENCOUNTER — Encounter (HOSPITAL_COMMUNITY)
Admission: RE | Disposition: A | Discharge status: Home / Self Care | Payer: Self-pay | Source: Ambulatory Visit | Attending: Surgery

## 2013-03-11 ENCOUNTER — Ambulatory Visit (HOSPITAL_COMMUNITY): Payer: BC Managed Care – PPO

## 2013-03-11 ENCOUNTER — Encounter (HOSPITAL_COMMUNITY): Payer: Self-pay | Admitting: Anesthesiology

## 2013-03-11 DIAGNOSIS — E785 Hyperlipidemia, unspecified: Secondary | ICD-10-CM | POA: Insufficient documentation

## 2013-03-11 DIAGNOSIS — Z885 Allergy status to narcotic agent status: Secondary | ICD-10-CM | POA: Insufficient documentation

## 2013-03-11 DIAGNOSIS — Z79899 Other long term (current) drug therapy: Secondary | ICD-10-CM | POA: Insufficient documentation

## 2013-03-11 DIAGNOSIS — N529 Male erectile dysfunction, unspecified: Secondary | ICD-10-CM | POA: Insufficient documentation

## 2013-03-11 DIAGNOSIS — F411 Generalized anxiety disorder: Secondary | ICD-10-CM | POA: Insufficient documentation

## 2013-03-11 DIAGNOSIS — K4090 Unilateral inguinal hernia, without obstruction or gangrene, not specified as recurrent: Secondary | ICD-10-CM | POA: Insufficient documentation

## 2013-03-11 DIAGNOSIS — I10 Essential (primary) hypertension: Secondary | ICD-10-CM | POA: Insufficient documentation

## 2013-03-11 DIAGNOSIS — C61 Malignant neoplasm of prostate: Secondary | ICD-10-CM | POA: Insufficient documentation

## 2013-03-11 DIAGNOSIS — Z87891 Personal history of nicotine dependence: Secondary | ICD-10-CM | POA: Insufficient documentation

## 2013-03-11 HISTORY — PX: INSERTION OF MESH: SHX5868

## 2013-03-11 HISTORY — DX: Shortness of breath: R06.02

## 2013-03-11 HISTORY — PX: INGUINAL HERNIA REPAIR: SHX194

## 2013-03-11 LAB — CBC
Hemoglobin: 14.7 g/dL (ref 13.0–17.0)
MCHC: 34.9 g/dL (ref 30.0–36.0)
Platelets: 237 10*3/uL (ref 150–400)
RBC: 4.95 MIL/uL (ref 4.22–5.81)

## 2013-03-11 LAB — BASIC METABOLIC PANEL
GFR calc non Af Amer: 74 mL/min — ABNORMAL LOW (ref >90)
Glucose, Bld: 97 mg/dL (ref 70–99)
Potassium: 4.1 mEq/L (ref 3.5–5.1)
Sodium: 138 mEq/L (ref 135–145)

## 2013-03-11 SURGERY — REPAIR, HERNIA, INGUINAL, ADULT
Anesthesia: General | Site: Groin | Laterality: Right | Wound class: Clean

## 2013-03-11 MED ORDER — LACTATED RINGERS IV SOLN
INTRAVENOUS | Status: DC
  Administered 2013-03-11: 12:00:00 via INTRAVENOUS

## 2013-03-11 MED ORDER — HYDROCODONE-ACETAMINOPHEN 5-325 MG PO TABS: 1.0000 | ORAL_TABLET | ORAL | Status: DC | PRN

## 2013-03-11 MED ORDER — ONDANSETRON HCL 4 MG/2ML IJ SOLN: INTRAMUSCULAR | Status: DC | PRN

## 2013-03-11 MED ORDER — FENTANYL CITRATE 0.05 MG/ML IJ SOLN: INTRAMUSCULAR | Status: DC | PRN

## 2013-03-11 MED ORDER — FENTANYL CITRATE 0.05 MG/ML IJ SOLN
INTRAMUSCULAR | Status: AC
  Filled 2013-03-11: qty 2

## 2013-03-11 MED ORDER — FENTANYL CITRATE 0.05 MG/ML IJ SOLN
INTRAMUSCULAR | Status: DC | PRN
  Administered 2013-03-11: 50 ug via INTRAVENOUS
  Administered 2013-03-11: 100 ug via INTRAVENOUS
  Administered 2013-03-11 (×2): 50 ug via INTRAVENOUS

## 2013-03-11 MED ORDER — FENTANYL CITRATE 0.05 MG/ML IJ SOLN
25.0000 ug | INTRAMUSCULAR | Status: DC | PRN
  Administered 2013-03-11: 50 ug via INTRAVENOUS

## 2013-03-11 MED ORDER — 0.9 % SODIUM CHLORIDE (POUR BTL) OPTIME
TOPICAL | Status: DC | PRN
  Administered 2013-03-11: 1000 mL

## 2013-03-11 MED ORDER — MIDAZOLAM HCL 5 MG/5ML IJ SOLN
INTRAMUSCULAR | Status: DC | PRN
  Administered 2013-03-11: 2 mg via INTRAVENOUS

## 2013-03-11 MED ORDER — BUPIVACAINE-EPINEPHRINE PF 0.5-1:200000 % IJ SOLN
INTRAMUSCULAR | Status: AC
  Filled 2013-03-11: qty 30

## 2013-03-11 MED ORDER — LACTATED RINGERS IV SOLN
INTRAVENOUS | Status: DC | PRN
  Administered 2013-03-11 (×2): via INTRAVENOUS

## 2013-03-11 MED ORDER — BUPIVACAINE-EPINEPHRINE 0.5% -1:200000 IJ SOLN
INTRAMUSCULAR | Status: DC | PRN
  Administered 2013-03-11: 20 mL

## 2013-03-11 MED ORDER — PROPOFOL 10 MG/ML IV BOLUS
INTRAVENOUS | Status: DC | PRN
  Administered 2013-03-11: 200 mg via INTRAVENOUS

## 2013-03-11 MED ORDER — KETOROLAC TROMETHAMINE 30 MG/ML IJ SOLN
INTRAMUSCULAR | Status: DC | PRN
  Administered 2013-03-11: 30 mg via INTRAVENOUS

## 2013-03-11 MED ORDER — ONDANSETRON HCL 4 MG/2ML IJ SOLN
INTRAMUSCULAR | Status: DC | PRN
  Administered 2013-03-11: 4 mg via INTRAVENOUS

## 2013-03-11 MED ORDER — LIDOCAINE HCL (CARDIAC) 20 MG/ML IV SOLN
INTRAVENOUS | Status: DC | PRN
  Administered 2013-03-11: 100 mg via INTRAVENOUS

## 2013-03-11 SURGICAL SUPPLY — 47 items
APL SKNCLS STERI-STRIP NONHPOA (GAUZE/BANDAGES/DRESSINGS) ×1
BENZOIN TINCTURE PRP APPL 2/3 (GAUZE/BANDAGES/DRESSINGS) ×2 IMPLANT
BLADE SURG 10 STRL SS (BLADE) ×2 IMPLANT
BLADE SURG 15 STRL LF DISP TIS (BLADE) ×1 IMPLANT
BLADE SURG 15 STRL SS (BLADE) ×2
BLADE SURG ROTATE 9660 (MISCELLANEOUS) ×1 IMPLANT
CHLORAPREP W/TINT 26ML (MISCELLANEOUS) ×2 IMPLANT
CLOTH BEACON ORANGE TIMEOUT ST (SAFETY) ×2 IMPLANT
COVER SURGICAL LIGHT HANDLE (MISCELLANEOUS) ×2 IMPLANT
DRAIN PENROSE 1/2X12 LTX STRL (WOUND CARE) ×1 IMPLANT
DRAPE LAPAROTOMY TRNSV 102X78 (DRAPE) ×2 IMPLANT
DRESSING TELFA 8X3 (GAUZE/BANDAGES/DRESSINGS) ×2 IMPLANT
DRSG TEGADERM 4X4.75 (GAUZE/BANDAGES/DRESSINGS) ×2 IMPLANT
ELECT CAUTERY BLADE 6.4 (BLADE) ×2 IMPLANT
ELECT REM PT RETURN 9FT ADLT (ELECTROSURGICAL) ×2
ELECTRODE REM PT RTRN 9FT ADLT (ELECTROSURGICAL) ×1 IMPLANT
GLOVE BIO SURGEON STRL SZ 6 (GLOVE) ×1 IMPLANT
GLOVE BIO SURGEON STRL SZ7.5 (GLOVE) ×1 IMPLANT
GLOVE BIOGEL M 6.5 STRL (GLOVE) ×1 IMPLANT
GLOVE BIOGEL PI IND STRL 6.5 (GLOVE) IMPLANT
GLOVE BIOGEL PI IND STRL 7.5 (GLOVE) IMPLANT
GLOVE BIOGEL PI INDICATOR 6.5 (GLOVE) ×2
GLOVE BIOGEL PI INDICATOR 7.5 (GLOVE) ×1
GLOVE SURG SIGNA 7.5 PF LTX (GLOVE) ×2 IMPLANT
GOWN STRL NON-REIN LRG LVL3 (GOWN DISPOSABLE) ×4 IMPLANT
GOWN STRL REIN XL XLG (GOWN DISPOSABLE) ×2 IMPLANT
KIT BASIN OR (CUSTOM PROCEDURE TRAY) ×2 IMPLANT
KIT ROOM TURNOVER OR (KITS) ×2 IMPLANT
MESH PARIETEX PROGRIP RIGHT (Mesh General) ×1 IMPLANT
NDL HYPO 25GX1X1/2 BEV (NEEDLE) ×1 IMPLANT
NEEDLE HYPO 25GX1X1/2 BEV (NEEDLE) ×2 IMPLANT
NS IRRIG 1000ML POUR BTL (IV SOLUTION) ×2 IMPLANT
PACK SURGICAL SETUP 50X90 (CUSTOM PROCEDURE TRAY) ×2 IMPLANT
PAD ARMBOARD 7.5X6 YLW CONV (MISCELLANEOUS) ×3 IMPLANT
PENCIL BUTTON HOLSTER BLD 10FT (ELECTRODE) ×2 IMPLANT
SPECIMEN JAR SMALL (MISCELLANEOUS) IMPLANT
SPONGE LAP 18X18 X RAY DECT (DISPOSABLE) ×2 IMPLANT
STRIP CLOSURE SKIN 1/2X4 (GAUZE/BANDAGES/DRESSINGS) ×2 IMPLANT
SUT MON AB 4-0 PC3 18 (SUTURE) ×2 IMPLANT
SUT SILK 2 0 SH (SUTURE) ×1 IMPLANT
SUT VIC AB 2-0 CT1 27 (SUTURE) ×4
SUT VIC AB 2-0 CT1 TAPERPNT 27 (SUTURE) ×2 IMPLANT
SUT VIC AB 3-0 CT1 27 (SUTURE) ×2
SUT VIC AB 3-0 CT1 TAPERPNT 27 (SUTURE) ×1 IMPLANT
SYR CONTROL 10ML LL (SYRINGE) ×2 IMPLANT
TOWEL OR 17X24 6PK STRL BLUE (TOWEL DISPOSABLE) ×2 IMPLANT
TOWEL OR 17X26 10 PK STRL BLUE (TOWEL DISPOSABLE) ×2 IMPLANT

## 2013-03-11 NOTE — Interval H&P Note (Signed)
History and Physical Interval Note: no change in H and P  03/11/2013 12:08 PM  James Lang  has presented today for surgery, with the diagnosis of ingiunal hernia   The various methods of treatment have been discussed with the patient and family. After consideration of risks, benefits and other options for treatment, the patient has consented to  Procedure(s): HERNIA REPAIR INGUINAL ADULT (Right) INSERTION OF MESH (Right) as a surgical intervention .  The patient's history has been reviewed, patient examined, no change in status, stable for surgery.  I have reviewed the patient's chart and labs.  Questions were answered to the patient's satisfaction.     Azalie Harbeck A

## 2013-03-11 NOTE — Transfer of Care (Signed)
Immediate Anesthesia Transfer of Care Note  Patient: James Lang  Procedure(s) Performed: Procedure(s): HERNIA REPAIR INGUINAL ADULT (Right) INSERTION OF MESH (Right)  Patient Location: PACU  Anesthesia Type:General  Level of Consciousness: awake, alert , oriented and patient cooperative  Airway & Oxygen Therapy: Patient Spontanous Breathing and Patient connected to nasal cannula oxygen  Post-op Assessment: Report given to PACU RN, Post -op Vital signs reviewed and stable and Patient moving all extremities  Post vital signs: Reviewed and stable  Complications: No apparent anesthesia complications

## 2013-03-11 NOTE — Anesthesia Preprocedure Evaluation (Signed)
Anesthesia Evaluation  Patient identified by MRN, date of birth, ID band Patient awake    Reviewed: Allergy & Precautions, H&P , NPO status , Patient's Chart, lab work & pertinent test results  Airway Mallampati: II      Dental   Pulmonary shortness of breath and with exertion,  breath sounds clear to auscultation        Cardiovascular hypertension, Pt. on medications Rhythm:Regular Rate:Normal     Neuro/Psych Anxiety    GI/Hepatic negative GI ROS, Neg liver ROS,   Endo/Other  negative endocrine ROS  Renal/GU negative Renal ROS     Musculoskeletal   Abdominal   Peds  Hematology   Anesthesia Other Findings   Reproductive/Obstetrics                           Anesthesia Physical Anesthesia Plan  ASA: III  Anesthesia Plan: General   Post-op Pain Management:    Induction: Intravenous  Airway Management Planned: Oral ETT  Additional Equipment:   Intra-op Plan:   Post-operative Plan: Extubation in OR  Informed Consent: I have reviewed the patients History and Physical, chart, labs and discussed the procedure including the risks, benefits and alternatives for the proposed anesthesia with the patient or authorized representative who has indicated his/her understanding and acceptance.   Dental advisory given  Plan Discussed with: CRNA and Anesthesiologist  Anesthesia Plan Comments:         Anesthesia Quick Evaluation

## 2013-03-11 NOTE — Preoperative (Signed)
Beta Blockers   Reason not to administer Beta Blockers:Not Applicable 

## 2013-03-11 NOTE — Anesthesia Procedure Notes (Signed)
Procedure Name: LMA Insertion Date/Time: 03/11/2013 12:41 PM Performed by: Sherie Don Pre-anesthesia Checklist: Patient identified, Emergency Drugs available, Suction available, Patient being monitored and Timeout performed Patient Re-evaluated:Patient Re-evaluated prior to inductionOxygen Delivery Method: Circle system utilized Preoxygenation: Pre-oxygenation with 100% oxygen Intubation Type: IV induction Ventilation: Mask ventilation without difficulty and Oral airway inserted - appropriate to patient size LMA: LMA inserted LMA Size: 5.0 Number of attempts: 1 Placement Confirmation: positive ETCO2 and breath sounds checked- equal and bilateral Tube secured with: Tape Dental Injury: Teeth and Oropharynx as per pre-operative assessment

## 2013-03-11 NOTE — Op Note (Signed)
HERNIA REPAIR INGUINAL ADULT, INSERTION OF MESH  Procedure Note  James Lang 03/11/2013   Pre-op Diagnosis: RIGHT INGUINAL HERNIA     Post-op Diagnosis: SAME  Procedure(s): HERNIA REPAIR INGUINAL ADULT INSERTION OF MESH (PROGRIP)  Surgeon(s): Shelly Rubenstein, MD  Anesthesia: General  Staff:  Circulator: Megan Day Cavanaugh, RN Scrub Person: Janeece Agee Pingue, CST  Estimated Blood Loss: Minimal                         [name]   Date: 03/11/2013  Time: 1:22 PM

## 2013-03-11 NOTE — Anesthesia Postprocedure Evaluation (Signed)
  Anesthesia Post-op Note  Patient: James Lang  Procedure(s) Performed: Procedure(s): HERNIA REPAIR INGUINAL ADULT (Right) INSERTION OF MESH (Right)  Patient Location: PACU  Anesthesia Type:General  Level of Consciousness: awake  Airway and Oxygen Therapy: Patient Spontanous Breathing  Post-op Pain: mild  Post-op Assessment: Post-op Vital signs reviewed  Post-op Vital Signs: Reviewed  Complications: No apparent anesthesia complications

## 2013-03-12 ENCOUNTER — Encounter (HOSPITAL_COMMUNITY): Payer: Self-pay | Admitting: Surgery

## 2013-03-12 NOTE — Op Note (Signed)
NAME:  [name], [name] NO.:  [id number]  MEDICAL RECORD NO.:  [id number]  LOCATION:  [facility]                         FACILITY:  [facility]  PHYSICIAN:  Abigail Miyamoto, M.D. DATE OF BIRTH:  Sep 12, 1950  DATE OF PROCEDURE:  03/11/2013 DATE OF DISCHARGE:  03/11/2013                              OPERATIVE REPORT   PREOPERATIVE DIAGNOSIS:  Right inguinal hernia.  POSTOPERATIVE DIAGNOSIS:  Right inguinal hernia.  PROCEDURE:  Right inguinal hernia repair with mesh.  SURGEON:  Abigail Miyamoto, M.D.  ANESTHESIA:  General and 0.5% Marcaine.  ESTIMATED BLOOD LOSS:  Minimal.  FINDINGS:  The patient was found to have an indirect right inguinal hernia, which was repaired with a piece of Proceed ProGrip mesh.  PROCEDURE IN DETAIL:  The patient was brought to the operating room, identified as James Lang.  He was placed supine on the operating table and general anesthesia was induced.  His abdomen and groin were then prepped and draped in the usual sterile fashion.  I performed an ilioinguinal nerve block with Marcaine and anesthetized the skin further in the groin with Marcaine as well.  I then made a longitudinal incision with a scalpel.  I took this down through Scarpa fascia with electrocautery.  The external oblique fascia then identified and opened towards the internal and external rings.  The testicular cord and structures were controlled with Penrose drain.  The patient had an indirect hernia sac containing the appendix.  I had to open up the sac and reduce the appendix back into the abdominal cavity.  I then tied off the base of the sac with a silk suture and excised the redundant sac.  A piece of Proceed ProGrip mesh was then brought into the field.  I placed it on inguinal floor, then brought around the cord structures.  I then secured it to the pubic tubercle with a 2-0 Vicryl suture.  Good coverage of the inguinal floor appeared to be achieved.  I then  closed the external oblique fascia over top of this with running 2-0 Vicryl suture.  I anesthetized the fascia and skin further with Marcaine.  I then closed Scarpa fascia with interrupted 3-0 Vicryl sutures and closed the skin with a running 4-0 Monocryl.  Steri-Strips, gauze, and Tegaderm were then applied.  The patient tolerated the procedure well.  All counts were correct at the end of the procedure. The patient was then extubated in the operating room and taken in stable condition to recovery room.     Abigail Miyamoto, M.D.     DB/MEDQ  D:  03/11/2013  T:  03/12/2013  Job:  [id number]

## 2013-03-24 ENCOUNTER — Encounter (INDEPENDENT_AMBULATORY_CARE_PROVIDER_SITE_OTHER): Payer: Self-pay

## 2013-04-16 ENCOUNTER — Ambulatory Visit (INDEPENDENT_AMBULATORY_CARE_PROVIDER_SITE_OTHER): Payer: BC Managed Care – PPO | Admitting: Surgery

## 2013-04-16 ENCOUNTER — Encounter (INDEPENDENT_AMBULATORY_CARE_PROVIDER_SITE_OTHER): Payer: Self-pay | Admitting: Surgery

## 2013-04-16 VITALS — BP 138/84 | HR 62 | Temp 97.6°F | Resp 12 | Ht 73.5 in | Wt 237.0 lb

## 2013-04-16 DIAGNOSIS — Z09 Encounter for follow-up examination after completed treatment for conditions other than malignant neoplasm: Secondary | ICD-10-CM

## 2013-04-16 NOTE — Progress Notes (Signed)
Subjective:     Patient ID: James Lang, male   DOB: 03/13/1951, 62 y.o.   MRN: [id number]  HPI  He is here for his first postop visit status post right inguinal hernia repair with mesh. He is doing well and has only mild discomfort Review of Systems     Objective:   Physical Exam On exam, his incision is healing well with no evidence of recurrence    Assessment:     Patient stable postop     Plan:     He may return to work and normal activity on August 22. I will see him back as needed

## 2013-05-06 ENCOUNTER — Other Ambulatory Visit: Payer: Self-pay | Admitting: Urology

## 2013-05-06 DIAGNOSIS — D49519 Neoplasm of unspecified behavior of unspecified kidney: Secondary | ICD-10-CM

## 2013-05-12 ENCOUNTER — Other Ambulatory Visit: Payer: BC Managed Care – PPO

## 2014-01-27 ENCOUNTER — Observation Stay (HOSPITAL_COMMUNITY)
Admission: AD | Admit: 2014-01-27 | Discharge: 2014-01-28 | Disposition: A | Discharge status: Home / Self Care | Payer: BC Managed Care – PPO | Source: Ambulatory Visit | Attending: Cardiovascular Disease | Admitting: Cardiovascular Disease

## 2014-01-27 ENCOUNTER — Encounter (HOSPITAL_COMMUNITY): Payer: Self-pay | Admitting: *Deleted

## 2014-01-27 ENCOUNTER — Observation Stay (HOSPITAL_COMMUNITY): Payer: BC Managed Care – PPO

## 2014-01-27 DIAGNOSIS — R0789 Other chest pain: Principal | ICD-10-CM | POA: Insufficient documentation

## 2014-01-27 DIAGNOSIS — J449 Chronic obstructive pulmonary disease, unspecified: Secondary | ICD-10-CM | POA: Insufficient documentation

## 2014-01-27 DIAGNOSIS — Z8546 Personal history of malignant neoplasm of prostate: Secondary | ICD-10-CM | POA: Insufficient documentation

## 2014-01-27 DIAGNOSIS — Z7982 Long term (current) use of aspirin: Secondary | ICD-10-CM | POA: Insufficient documentation

## 2014-01-27 DIAGNOSIS — R0602 Shortness of breath: Secondary | ICD-10-CM | POA: Insufficient documentation

## 2014-01-27 DIAGNOSIS — N529 Male erectile dysfunction, unspecified: Secondary | ICD-10-CM | POA: Insufficient documentation

## 2014-01-27 DIAGNOSIS — Z87891 Personal history of nicotine dependence: Secondary | ICD-10-CM | POA: Insufficient documentation

## 2014-01-27 DIAGNOSIS — I10 Essential (primary) hypertension: Secondary | ICD-10-CM | POA: Insufficient documentation

## 2014-01-27 DIAGNOSIS — J4489 Other specified chronic obstructive pulmonary disease: Secondary | ICD-10-CM | POA: Insufficient documentation

## 2014-01-27 DIAGNOSIS — E669 Obesity, unspecified: Secondary | ICD-10-CM | POA: Insufficient documentation

## 2014-01-27 DIAGNOSIS — I2510 Atherosclerotic heart disease of native coronary artery without angina pectoris: Secondary | ICD-10-CM | POA: Insufficient documentation

## 2014-01-27 DIAGNOSIS — R079 Chest pain, unspecified: Secondary | ICD-10-CM | POA: Diagnosis present

## 2014-01-27 DIAGNOSIS — I428 Other cardiomyopathies: Secondary | ICD-10-CM | POA: Insufficient documentation

## 2014-01-27 DIAGNOSIS — E785 Hyperlipidemia, unspecified: Secondary | ICD-10-CM | POA: Insufficient documentation

## 2014-01-27 DIAGNOSIS — F411 Generalized anxiety disorder: Secondary | ICD-10-CM | POA: Insufficient documentation

## 2014-01-27 LAB — CBC WITH DIFFERENTIAL/PLATELET
Basophils Absolute: 0 10*3/uL (ref 0.0–0.1)
Basophils Relative: 1 % (ref 0–1)
EOS ABS: 0.1 10*3/uL (ref 0.0–0.7)
Eosinophils Relative: 1 % (ref 0–5)
HCT: 43.3 % (ref 39.0–52.0)
Hemoglobin: 14.8 g/dL (ref 13.0–17.0)
Lymphocytes Relative: 24 % (ref 12–46)
Lymphs Abs: 1.9 10*3/uL (ref 0.7–4.0)
MCH: 29.8 pg (ref 26.0–34.0)
MCHC: 34.2 g/dL (ref 30.0–36.0)
MCV: 87.3 fL (ref 78.0–100.0)
Monocytes Absolute: 0.7 10*3/uL (ref 0.1–1.0)
Monocytes Relative: 9 % (ref 3–12)
NEUTROS ABS: 5.3 10*3/uL (ref 1.7–7.7)
NEUTROS PCT: 65 % (ref 43–77)
Platelets: 278 10*3/uL (ref 150–400)
RBC: 4.96 MIL/uL (ref 4.22–5.81)
RDW: 13.4 % (ref 11.5–15.5)
WBC: 8.1 10*3/uL (ref 4.0–10.5)

## 2014-01-27 LAB — COMPREHENSIVE METABOLIC PANEL
ALT: 20 U/L (ref 0–53)
AST: 16 U/L (ref 0–37)
Albumin: 3.7 g/dL (ref 3.5–5.2)
Alkaline Phosphatase: 48 U/L (ref 39–117)
BUN: 16 mg/dL (ref 6–23)
CHLORIDE: 104 meq/L (ref 96–112)
CO2: 26 meq/L (ref 19–32)
CREATININE: 1.03 mg/dL (ref 0.50–1.35)
Calcium: 9.4 mg/dL (ref 8.4–10.5)
GFR, EST AFRICAN AMERICAN: 89 mL/min — AB (ref >90)
GFR, EST NON AFRICAN AMERICAN: 76 mL/min — AB (ref >90)
GLUCOSE: 88 mg/dL (ref 70–99)
Potassium: 4.9 mEq/L (ref 3.7–5.3)
Sodium: 139 mEq/L (ref 137–147)
Total Bilirubin: 0.6 mg/dL (ref 0.3–1.2)
Total Protein: 6.4 g/dL (ref 6.0–8.3)

## 2014-01-27 LAB — PROTIME-INR
INR: 1.1 (ref 0.00–1.49)
Prothrombin Time: 14 seconds (ref 11.6–15.2)

## 2014-01-27 LAB — TROPONIN I

## 2014-01-27 LAB — HEPARIN LEVEL (UNFRACTIONATED): Heparin Unfractionated: 0.19 IU/mL — ABNORMAL LOW (ref 0.30–0.70)

## 2014-01-27 MED ORDER — ASPIRIN EC 81 MG PO TBEC
81.0000 mg | DELAYED_RELEASE_TABLET | Freq: Every day | ORAL | Status: DC
  Administered 2014-01-28: 81 mg via ORAL
  Filled 2014-01-27: qty 1

## 2014-01-27 MED ORDER — NITROGLYCERIN 0.4 MG SL SUBL: 0.4000 mg | SUBLINGUAL_TABLET | SUBLINGUAL | Status: DC | PRN

## 2014-01-27 MED ORDER — ROSUVASTATIN CALCIUM 10 MG PO TABS
10.0000 mg | ORAL_TABLET | Freq: Every day | ORAL | Status: DC
  Administered 2014-01-27: 10 mg via ORAL
  Filled 2014-01-27 (×3): qty 1

## 2014-01-27 MED ORDER — ASPIRIN 81 MG PO CHEW: 324.0000 mg | CHEWABLE_TABLET | ORAL | Status: AC

## 2014-01-27 MED ORDER — ACETAMINOPHEN 325 MG PO TABS: 650.0000 mg | ORAL_TABLET | ORAL | Status: DC | PRN

## 2014-01-27 MED ORDER — HEPARIN (PORCINE) IN NACL 100-0.45 UNIT/ML-% IJ SOLN
1650.0000 [IU]/h | INTRAMUSCULAR | Status: DC
  Administered 2014-01-27: 1450 [IU]/h via INTRAVENOUS
  Administered 2014-01-28: 1650 [IU]/h via INTRAVENOUS
  Filled 2014-01-27 (×3): qty 250

## 2014-01-27 MED ORDER — HEPARIN BOLUS VIA INFUSION
4000.0000 [IU] | Freq: Once | INTRAVENOUS | Status: AC
  Administered 2014-01-27: 4000 [IU] via INTRAVENOUS
  Filled 2014-01-27: qty 4000

## 2014-01-27 MED ORDER — ALPRAZOLAM 0.5 MG PO TABS
0.5000 mg | ORAL_TABLET | Freq: Three times a day (TID) | ORAL | Status: DC | PRN
  Administered 2014-01-28: 0.5 mg via ORAL

## 2014-01-27 MED ORDER — RAMIPRIL 10 MG PO CAPS
10.0000 mg | ORAL_CAPSULE | Freq: Two times a day (BID) | ORAL | Status: DC
  Administered 2014-01-27 – 2014-01-28 (×2): 10 mg via ORAL
  Filled 2014-01-27 (×4): qty 1

## 2014-01-27 MED ORDER — AMLODIPINE BESYLATE 10 MG PO TABS
10.0000 mg | ORAL_TABLET | Freq: Every day | ORAL | Status: DC
  Administered 2014-01-28: 10 mg via ORAL
  Filled 2014-01-27: qty 1

## 2014-01-27 MED ORDER — ONDANSETRON HCL 4 MG/2ML IJ SOLN: 4.0000 mg | Freq: Four times a day (QID) | INTRAMUSCULAR | Status: DC | PRN

## 2014-01-27 MED ORDER — ASPIRIN 300 MG RE SUPP
300.0000 mg | RECTAL | Status: AC
  Filled 2014-01-27: qty 1

## 2014-01-27 MED ORDER — NON FORMULARY: 10.0000 mg | Freq: Every day | Status: DC

## 2014-01-27 NOTE — Progress Notes (Signed)
ANTICOAGULATION CONSULT NOTE - Initial Consult  Pharmacy Consult for Heparin Indication: chest pain/ACS  Allergies  Allergen Reactions  . Codeine Nausea Only    Patient Measurements: Height: 6\' 1"  (185.4 cm) IBW/kg (Calculated) : 79.9 Stated wt ~108 kg Heparin Dosing Weight: 102 kg  Vital Signs: Temp: 97.7 F (36.5 C) (05/28 1346) Temp src: Oral (05/28 1346) BP: 152/85 mmHg (05/28 1346) Pulse Rate: 55 (05/28 1346)  Labs: No results found for this basename: HGB, HCT, PLT, APTT, LABPROT, INR, HEPARINUNFRC, CREATININE, CKTOTAL, CKMB, TROPONINI,  in the last 72 hours  The CrCl is unknown because both a height and weight (above a minimum accepted value) are required for this calculation.   Medical History: Past Medical History  Diagnosis Date  . Anxiety   . Hyperlipidemia   . Hypertension   . Impotence of organic origin   . Personal history of malignant neoplasm of prostate   . Cancer   . Shortness of breath     with exertion    Medications:  Prescriptions prior to admission  Medication Sig Dispense Refill  . ALPRAZolam (XANAX) 0.5 MG tablet Take 0.5 mg by mouth 3 (three) times daily as needed for anxiety.       Marland Kitchen amLODipine (NORVASC) 10 MG tablet Take 10 mg by mouth daily.      Marland Kitchen aspirin EC 81 MG tablet Take 81 mg by mouth every other day.      . ramipril (ALTACE) 10 MG capsule Take 10 mg by mouth 2 (two) times daily.       . rosuvastatin (CRESTOR) 10 MG tablet Take 10 mg by mouth daily.        Assessment: 63 y.o. male presents with CP. To begin heparin for r/o ACS. Baseline labs pending.   Goal of Therapy:  Heparin level 0.3-0.7 units/ml Monitor platelets by anticoagulation protocol: Yes   Plan:  1. Heparin IV bolus 4000 units 2. Heparin gtt at 1450 units/hr 3. Will f/u 6 hr heparin level 4. Daily heparin level and CBC  Sherlon Handing, PharmD, BCPS Clinical pharmacist, pager [phone] 01/27/2014,2:01 PM

## 2014-01-27 NOTE — Progress Notes (Addendum)
ANTICOAGULATION CONSULT NOTE - Follow Up Consult  Pharmacy Consult for Heparin  Indication: chest pain/ACS  Allergies  Allergen Reactions  . Codeine Nausea Only   Patient Measurements: Height: 6\' 1"  (185.4 cm) Weight: 189 lb 1.6 oz (85.775 kg) IBW/kg (Calculated) : 79.9  Vital Signs: Temp: 98.2 F (36.8 C) (05/28 2056) Temp src: Oral (05/28 2056) BP: 142/82 mmHg (05/28 2056) Pulse Rate: 50 (05/28 2056)  Labs:  Recent Labs  01/27/14 1500 01/27/14 2125 01/27/14 2200  HGB 14.8  --   --   HCT 43.3  --   --   PLT 278  --   --   LABPROT 14.0  --   --   INR 1.10  --   --   HEPARINUNFRC  --   --  0.19*  CREATININE 1.03  --   --   TROPONINI <0.30 <0.30  --     Estimated Creatinine Clearance: 85.1 ml/min (by C-G formula based on Cr of 1.03).   Medications:  Heparin 1450 units/hr  Assessment: 63 y/o M on heparin for chest discomfort. Troponin neg x 2. First HL is 0.19, other labs as above.   Goal of Therapy:  Heparin level 0.3-0.7 units/ml Monitor platelets by anticoagulation protocol: Yes   Plan:  -Increase heparin drip to 1650 units/hr -0500 HL -Daily CBC/HL -Monitor for bleeding  Narda Bonds 01/27/2014,11:12 PM  Update Heparin level of 0.23 was drawn early and only ~2 hours after rate change -Will re-order heparin level for appropriate time JLedford, PharmD  Update 7:39 AM HL this AM is 0.39, continue at 1650 units/hr, 1300 HL to confirm Martin, PharmD

## 2014-01-27 NOTE — H&P (Signed)
Referring Physician: None  James Lang is an 63 y.o. male.                       Chief Complaint: Chest discomfort HPI: 63 year old male with one day history of heaviness on chest, non-radiating with exertional shortness of breath. No  Fever or cough.   Past Medical History  Diagnosis Date  . Anxiety   . Hyperlipidemia   . Hypertension   . Impotence of organic origin   . Personal history of malignant neoplasm of prostate   . Cancer   . Shortness of breath     with exertion      Past Surgical History  Procedure Laterality Date  . Carpel tunnel Bilateral   . Foot surgery Right 1970    Toes shot off. 1ST TOE REMAINS  . Prostate surgery  2005  . Inguinal hernia repair Right 03/11/2013    Procedure: HERNIA REPAIR INGUINAL ADULT;  Surgeon: Harl Bowie, MD;  Location: [facility];  Service: General;  Laterality: Right;  . Insertion of mesh Right 03/11/2013    Procedure: INSERTION OF MESH;  Surgeon: Harl Bowie, MD;  Location: [facility];  Service: General;  Laterality: Right;  . Hernia repair      No family history on file. Social History:  reports that he quit smoking about 10 years ago. His smoking use included Cigarettes. He smoked 0.00 packs per day for 20 years. He has never used smokeless tobacco. He reports that he drinks about 6 ounces of alcohol per week. He reports that he does not use illicit drugs.  Allergies:  Allergies  Allergen Reactions  . Codeine Nausea Only    Medications Prior to Admission  Medication Sig Dispense Refill  . ALPRAZolam (XANAX) 0.5 MG tablet Take 0.5 mg by mouth 3 (three) times daily as needed for anxiety.       Marland Kitchen amLODipine (NORVASC) 10 MG tablet Take 10 mg by mouth daily.      Marland Kitchen aspirin EC 81 MG tablet Take 81 mg by mouth every other day.      . ramipril (ALTACE) 10 MG capsule Take 10 mg by mouth 2 (two) times daily.       . rosuvastatin (CRESTOR) 10 MG tablet Take 10 mg by mouth daily.        Results for orders placed during the  hospital encounter of 01/27/14 (from the past 48 hour(s))  TROPONIN I     Status: None   Collection Time    01/27/14  3:00 PM      Result Value Ref Range   Troponin I <0.30  <0.30 ng/mL   Comment:            Due to the release kinetics of cTnI,     a negative result within the first hours     of the onset of symptoms does not rule out     myocardial infarction with certainty.     If myocardial infarction is still suspected,     repeat the test at appropriate intervals.  PROTIME-INR     Status: None   Collection Time    01/27/14  3:00 PM      Result Value Ref Range   Prothrombin Time 14.0  11.6 - 15.2 seconds   INR 1.10  0.00 - 1.49  COMPREHENSIVE METABOLIC PANEL     Status: Abnormal   Collection Time    01/27/14  3:00 PM  Result Value Ref Range   Sodium 139  137 - 147 mEq/L   Potassium 4.9  3.7 - 5.3 mEq/L   Chloride 104  96 - 112 mEq/L   CO2 26  19 - 32 mEq/L   Glucose, Bld 88  70 - 99 mg/dL   BUN 16  6 - 23 mg/dL   Creatinine, Ser 1.03  0.50 - 1.35 mg/dL   Calcium 9.4  8.4 - 10.5 mg/dL   Total Protein 6.4  6.0 - 8.3 g/dL   Albumin 3.7  3.5 - 5.2 g/dL   AST 16  0 - 37 U/L   ALT 20  0 - 53 U/L   Alkaline Phosphatase 48  39 - 117 U/L   Total Bilirubin 0.6  0.3 - 1.2 mg/dL   GFR calc non Af Amer 76 (*) >90 mL/min   GFR calc Af Amer 89 (*) >90 mL/min   Comment: (NOTE)     The eGFR has been calculated using the CKD EPI equation.     This calculation has not been validated in all clinical situations.     eGFR's persistently <90 mL/min signify possible Chronic Kidney     Disease.  CBC WITH DIFFERENTIAL     Status: None   Collection Time    01/27/14  3:00 PM      Result Value Ref Range   WBC 8.1  4.0 - 10.5 K/uL   RBC 4.96  4.22 - 5.81 MIL/uL   Hemoglobin 14.8  13.0 - 17.0 g/dL   HCT 43.3  39.0 - 52.0 %   MCV 87.3  78.0 - 100.0 fL   MCH 29.8  26.0 - 34.0 pg   MCHC 34.2  30.0 - 36.0 g/dL   RDW 13.4  11.5 - 15.5 %   Platelets 278  150 - 400 K/uL   Neutrophils  Relative % 65  43 - 77 %   Neutro Abs 5.3  1.7 - 7.7 K/uL   Lymphocytes Relative 24  12 - 46 %   Lymphs Abs 1.9  0.7 - 4.0 K/uL   Monocytes Relative 9  3 - 12 %   Monocytes Absolute 0.7  0.1 - 1.0 K/uL   Eosinophils Relative 1  0 - 5 %   Eosinophils Absolute 0.1  0.0 - 0.7 K/uL   Basophils Relative 1  0 - 1 %   Basophils Absolute 0.0  0.0 - 0.1 K/uL   No results found.  Review of Systems  Constitutional: Negative for fever, chills and unexpected weight change.  HENT: Negative for hearing loss, congestion, sore throat, trouble swallowing and voice change.  Eyes: Negative for visual disturbance.  Respiratory: Negative for cough and wheezing.  Cardiovascular: Negative for palpitations and leg swelling. Positive for chest pain. Gastrointestinal: Negative for nausea, vomiting, abdominal pain, diarrhea, constipation, blood in stool, abdominal distention, anal bleeding and rectal pain.  Genitourinary: Negative for hematuria and difficulty urinating.  Musculoskeletal: Negative for arthralgias.  Skin: Negative for rash and wound.  Neurological: Negative for seizures, syncope, weakness and headaches.  Hematological: Negative for adenopathy. Does not bruise/bleed easily.  Psychiatric/Behavioral: Negative for confusion. Positive anxiety  Blood pressure 152/85, pulse 55, temperature 97.7 F (36.5 C), temperature source Oral, resp. rate 18, height 6' 1"  (1.854 m), SpO2 100.00%. Physical Exam  Constitutional: He appears well-developed and well-nourished. No distress.  HENT: Head: Normocephalic and atraumatic. Right Ear: External ear normal. Left Ear: External ear normal.  Nose: Nose normal. Mouth/Throat: Oropharynx is clear and  moist. No oropharyngeal exudate.  Eyes: Blue, Conjunctivae pink, Pupils are equal, round, and reactive to light. Right eye exhibits no discharge. Left eye exhibits no discharge. No scleral icterus.  Neck: Normal range of motion. Neck supple. No tracheal deviation present. No  thyromegaly present.  Cardiovascular: Normal rate, regular rhythm, normal heart sounds and intact distal pulses. No murmur heard.  Pulmonary/Chest: Effort normal and breath sounds normal. No respiratory distress. He has no wheezes.  Abdominal: Soft. Bowel sounds are normal. He exhibits no distension. There is no tenderness. There is no rebound.  There is a very small easily reducible right inguinal hernia. There is no evidence of left inguinal hernia or umbilical hernia.  Musculoskeletal: Normal range of motion. S/P foot surgery bilateral. Lymphadenopathy: He has no cervical adenopathy.  Neurological: He is alert and oriented to person, place, and time. Moves all 4 extremities. Skin: Skin is warm and dry. No rash noted.  Psychiatric: His behavior is normal. Judgment normal.   Assessment/Plan Atypical chest pain Exertional shortness of breath Hypertension H/O tobacco use disorder COPD R/O CAD Obesity  Place in observation./Oxygen/ Heparin drip. R/O MI. Nuclear stress test or cardiac catheterization in AM.  Birdie Riddle 01/27/2014, 6:33 PM

## 2014-01-28 ENCOUNTER — Observation Stay (HOSPITAL_COMMUNITY): Payer: BC Managed Care – PPO

## 2014-01-28 LAB — CBC
HCT: 42.6 % (ref 39.0–52.0)
Hemoglobin: 14.5 g/dL (ref 13.0–17.0)
MCH: 29.8 pg (ref 26.0–34.0)
MCHC: 34 g/dL (ref 30.0–36.0)
MCV: 87.7 fL (ref 78.0–100.0)
Platelets: 251 10*3/uL (ref 150–400)
RBC: 4.86 MIL/uL (ref 4.22–5.81)
RDW: 13.5 % (ref 11.5–15.5)
WBC: 6.8 10*3/uL (ref 4.0–10.5)

## 2014-01-28 LAB — LIPID PANEL
Cholesterol: 122 mg/dL (ref 0–200)
HDL: 36 mg/dL — ABNORMAL LOW (ref >39)
LDL Cholesterol: 65 mg/dL (ref 0–99)
Total CHOL/HDL Ratio: 3.4 RATIO
Triglycerides: 106 mg/dL (ref <150)
VLDL: 21 mg/dL (ref 0–40)

## 2014-01-28 LAB — HEPARIN LEVEL (UNFRACTIONATED)
Heparin Unfractionated: 0.23 IU/mL — ABNORMAL LOW (ref 0.30–0.70)
Heparin Unfractionated: 0.39 IU/mL (ref 0.30–0.70)

## 2014-01-28 LAB — TROPONIN I: Troponin I: <0.3 ng/mL (ref <0.30)

## 2014-01-28 MED ORDER — ALPRAZOLAM 0.25 MG PO TABS
ORAL_TABLET | ORAL | Status: AC
  Filled 2014-01-28: qty 2

## 2014-01-28 MED ORDER — TECHNETIUM TC 99M SESTAMIBI GENERIC - CARDIOLITE
10.0000 | Freq: Once | INTRAVENOUS | Status: AC | PRN
  Administered 2014-01-28: 10 via INTRAVENOUS

## 2014-01-28 MED ORDER — REGADENOSON 0.4 MG/5ML IV SOLN
INTRAVENOUS | Status: AC
  Filled 2014-01-28: qty 5

## 2014-01-28 MED ORDER — REGADENOSON 0.4 MG/5ML IV SOLN
0.4000 mg | Freq: Once | INTRAVENOUS | Status: AC
  Administered 2014-01-28: 0.4 mg via INTRAVENOUS
  Filled 2014-01-28: qty 5

## 2014-01-28 MED ORDER — TECHNETIUM TC 99M SESTAMIBI GENERIC - CARDIOLITE
30.0000 | Freq: Once | INTRAVENOUS | Status: AC | PRN
  Administered 2014-01-28: 30 via INTRAVENOUS

## 2014-01-28 NOTE — Discharge Summary (Signed)
Physician Discharge Summary  Patient ID: James Lang MRN: [id number] DOB/AGE: [birth year] 63 y.o.  Admit date: 01/27/2014 Discharge date: 01/28/2014  Admission Diagnoses: Atypical chest pain  Exertional shortness of breath  Hypertension  H/O tobacco use disorder  COPD  R/O CAD  Obesity  Discharge Diagnoses:  Principle Problem: * Atypical chest pain * Exertional shortness of breath  Non-ischemic cardiomyopathy Hypertension  H/O tobacco use disorder  COPD  CAD  Obesity   Discharged Condition: fair  Hospital Course: 63 year old male with one day history of heaviness on chest, non-radiating with exertional shortness of breath. No Fever or cough. His cardiac enzymes were normal. He underwent nuclear stress test that showed inferior scar and no reversible ischemia.  Consults: cardiology  Significant Diagnostic Studies: labs: Near nornmal CBC, CMET, Lipid panel with mild decrease in HDL cholesterol. CXR-Subtle right basilar airspace opacity may reflect atelectasis or very early infiltrate in the appropriate clinical setting. Nuclear stress test with inferior wall scarring and EF 51 %  Treatments: cardiac meds: ramipril (Altace), Rosuvastatin, amlodipine and aspirin  Discharge Exam: Blood pressure 120/59, pulse 70, temperature 97.7 F (36.5 C), temperature source Oral, resp. rate 18, height 6\' 1"  (1.854 m), weight 109.589 kg (241 lb 9.6 oz), SpO2 100.00%. Constitutional: He appears well-developed and well-nourished. No distress.  HENT: Head: Normocephalic and atraumatic. Right Ear: External ear normal. Left Ear: External ear normal. Nose: Nose normal. Mouth/Throat: Oropharynx is clear and moist. No oropharyngeal exudate.  Eyes: Blue, Conjunctivae pink, Pupils are equal, round, and reactive to light. Right eye exhibits no discharge. Left eye exhibits no discharge. No scleral icterus.  Neck: Normal range of motion. Neck supple. No tracheal deviation present. No thyromegaly  present.  Cardiovascular: Normal rate, regular rhythm, normal heart sounds and intact distal pulses. No murmur heard.  Pulmonary/Chest: Effort normal and breath sounds normal. No respiratory distress. He has no wheezes.  Abdominal: Soft. Bowel sounds are normal. He exhibits no distension. There is no tenderness. There is no rebound. There is no evidence of left inguinal hernia or umbilical hernia.  Musculoskeletal: Normal range of motion. S/P foot surgery bilateral. Lymphadenopathy: He has no cervical adenopathy.  Neurological: He is alert and oriented to person, place, and time. Moves all 4 extremities.  Skin: Skin is warm and dry. No rash noted.  Psychiatric: His behavior is normal. Judgment normal.   Disposition: 01-Home or Self Care     Medication List         ALPRAZolam 0.5 MG tablet  Commonly known as:  XANAX  Take 0.5 mg by mouth 3 (three) times daily as needed for anxiety.     amLODipine 10 MG tablet  Commonly known as:  NORVASC  Take 10 mg by mouth daily.     aspirin EC 81 MG tablet  Take 81 mg by mouth every other day.     ramipril 10 MG capsule  Commonly known as:  ALTACE  Take 10 mg by mouth 2 (two) times daily.     rosuvastatin 10 MG tablet  Commonly known as:  CRESTOR  Take 10 mg by mouth daily.           Follow-up Information   Follow up with [facility], MD In 1 month.   Specialty:  Cardiology   Contact information:   [place] [address] [phone]       Signed: Birdie Riddle 01/28/2014, 2:49 PM

## 2014-01-28 NOTE — Progress Notes (Signed)
IV and tele monitor d/c at this time; pt given d/c instructions; pt to d/c home with wife; no needs voiced; will cont. To monitor.

## 2014-01-28 NOTE — Progress Notes (Signed)
UR completed 

## 2017-02-24 ENCOUNTER — Observation Stay (HOSPITAL_COMMUNITY): Payer: BLUE CROSS/BLUE SHIELD

## 2017-02-24 ENCOUNTER — Emergency Department (HOSPITAL_COMMUNITY): Payer: BLUE CROSS/BLUE SHIELD

## 2017-02-24 ENCOUNTER — Encounter (HOSPITAL_COMMUNITY): Payer: Self-pay

## 2017-02-24 ENCOUNTER — Observation Stay (HOSPITAL_COMMUNITY)
Admission: EM | Admit: 2017-02-24 | Discharge: 2017-02-25 | Disposition: A | Discharge status: Home / Self Care | Payer: BLUE CROSS/BLUE SHIELD | Attending: Cardiovascular Disease | Admitting: Cardiovascular Disease

## 2017-02-24 ENCOUNTER — Other Ambulatory Visit: Payer: Self-pay

## 2017-02-24 DIAGNOSIS — E785 Hyperlipidemia, unspecified: Secondary | ICD-10-CM | POA: Diagnosis not present

## 2017-02-24 DIAGNOSIS — Z79899 Other long term (current) drug therapy: Secondary | ICD-10-CM | POA: Insufficient documentation

## 2017-02-24 DIAGNOSIS — Z6833 Body mass index (BMI) 33.0-33.9, adult: Secondary | ICD-10-CM | POA: Insufficient documentation

## 2017-02-24 DIAGNOSIS — R0602 Shortness of breath: Secondary | ICD-10-CM | POA: Diagnosis present

## 2017-02-24 DIAGNOSIS — Z7982 Long term (current) use of aspirin: Secondary | ICD-10-CM | POA: Diagnosis not present

## 2017-02-24 DIAGNOSIS — I10 Essential (primary) hypertension: Secondary | ICD-10-CM | POA: Diagnosis not present

## 2017-02-24 DIAGNOSIS — F419 Anxiety disorder, unspecified: Secondary | ICD-10-CM | POA: Diagnosis not present

## 2017-02-24 DIAGNOSIS — G459 Transient cerebral ischemic attack, unspecified: Secondary | ICD-10-CM | POA: Diagnosis present

## 2017-02-24 DIAGNOSIS — Z1389 Encounter for screening for other disorder: Secondary | ICD-10-CM

## 2017-02-24 DIAGNOSIS — R42 Dizziness and giddiness: Secondary | ICD-10-CM | POA: Diagnosis present

## 2017-02-24 DIAGNOSIS — E669 Obesity, unspecified: Secondary | ICD-10-CM | POA: Insufficient documentation

## 2017-02-24 DIAGNOSIS — Z8546 Personal history of malignant neoplasm of prostate: Secondary | ICD-10-CM | POA: Diagnosis not present

## 2017-02-24 DIAGNOSIS — Z87891 Personal history of nicotine dependence: Secondary | ICD-10-CM | POA: Diagnosis not present

## 2017-02-24 DIAGNOSIS — Z0189 Encounter for other specified special examinations: Secondary | ICD-10-CM

## 2017-02-24 LAB — I-STAT CHEM 8, ED
BUN: 16 mg/dL (ref 6–20)
CHLORIDE: 101 mmol/L (ref 101–111)
Calcium, Ion: 1.12 mmol/L — ABNORMAL LOW (ref 1.15–1.40)
Creatinine, Ser: 1.1 mg/dL (ref 0.61–1.24)
Glucose, Bld: 101 mg/dL — ABNORMAL HIGH (ref 65–99)
HEMATOCRIT: 49 % (ref 39.0–52.0)
HEMOGLOBIN: 16.7 g/dL (ref 13.0–17.0)
POTASSIUM: 4.1 mmol/L (ref 3.5–5.1)
Sodium: 137 mmol/L (ref 135–145)
TCO2: 25 mmol/L (ref 0–100)

## 2017-02-24 LAB — URINALYSIS, ROUTINE W REFLEX MICROSCOPIC
Bilirubin Urine: NEGATIVE
Glucose, UA: NEGATIVE mg/dL
Hgb urine dipstick: NEGATIVE
Ketones, ur: NEGATIVE mg/dL
LEUKOCYTES UA: NEGATIVE
NITRITE: NEGATIVE
PROTEIN: NEGATIVE mg/dL
Specific Gravity, Urine: 1.017 (ref 1.005–1.030)
pH: 6 (ref 5.0–8.0)

## 2017-02-24 LAB — BASIC METABOLIC PANEL
Anion gap: 9 (ref 5–15)
BUN: 14 mg/dL (ref 6–20)
CO2: 25 mmol/L (ref 22–32)
Calcium: 9.4 mg/dL (ref 8.9–10.3)
Chloride: 103 mmol/L (ref 101–111)
Creatinine, Ser: 1.18 mg/dL (ref 0.61–1.24)
GFR calc Af Amer: >60 mL/min (ref >60)
GFR calc non Af Amer: >60 mL/min (ref >60)
Glucose, Bld: 104 mg/dL — ABNORMAL HIGH (ref 65–99)
Potassium: 4.1 mmol/L (ref 3.5–5.1)
SODIUM: 137 mmol/L (ref 135–145)

## 2017-02-24 LAB — CBC
HCT: 48.2 % (ref 39.0–52.0)
Hemoglobin: 16.4 g/dL (ref 13.0–17.0)
MCH: 29.7 pg (ref 26.0–34.0)
MCHC: 34 g/dL (ref 30.0–36.0)
MCV: 87.2 fL (ref 78.0–100.0)
PLATELETS: 300 10*3/uL (ref 150–400)
RBC: 5.53 MIL/uL (ref 4.22–5.81)
RDW: 13.3 % (ref 11.5–15.5)
WBC: 9.6 10*3/uL (ref 4.0–10.5)

## 2017-02-24 LAB — PROTIME-INR
INR: 0.98
Prothrombin Time: 13 seconds (ref 11.4–15.2)

## 2017-02-24 LAB — DIFFERENTIAL
Basophils Absolute: 0 10*3/uL (ref 0.0–0.1)
Basophils Relative: 0 %
Eosinophils Absolute: 0.1 10*3/uL (ref 0.0–0.7)
Eosinophils Relative: 1 %
LYMPHS PCT: 22 %
Lymphs Abs: 2.1 10*3/uL (ref 0.7–4.0)
Monocytes Absolute: 0.8 10*3/uL (ref 0.1–1.0)
Monocytes Relative: 8 %
NEUTROS ABS: 6.6 10*3/uL (ref 1.7–7.7)
NEUTROS PCT: 69 %

## 2017-02-24 LAB — D-DIMER, QUANTITATIVE (NOT AT ARMC): D-Dimer, Quant: <0.27 ug/mL-FEU (ref 0.00–0.50)

## 2017-02-24 LAB — I-STAT TROPONIN, ED
TROPONIN I, POC: 0.02 ng/mL (ref 0.00–0.08)
Troponin i, poc: 0 ng/mL (ref 0.00–0.08)

## 2017-02-24 LAB — APTT: APTT: 30 s (ref 24–36)

## 2017-02-24 MED ORDER — SENNOSIDES-DOCUSATE SODIUM 8.6-50 MG PO TABS: 1.0000 | ORAL_TABLET | Freq: Every evening | ORAL | Status: DC | PRN

## 2017-02-24 MED ORDER — STROKE: EARLY STAGES OF RECOVERY BOOK
Freq: Once | Status: DC
  Filled 2017-02-24: qty 1

## 2017-02-24 MED ORDER — MOMETASONE FURO-FORMOTEROL FUM 100-5 MCG/ACT IN AERO
2.0000 | INHALATION_SPRAY | Freq: Two times a day (BID) | RESPIRATORY_TRACT | Status: DC
Start: 2017-02-24 — End: 2017-02-25
  Filled 2017-02-24: qty 8.8

## 2017-02-24 MED ORDER — ENOXAPARIN SODIUM 40 MG/0.4ML ~~LOC~~ SOLN
40.0000 mg | SUBCUTANEOUS | Status: DC
  Administered 2017-02-24: 40 mg via SUBCUTANEOUS
  Filled 2017-02-24: qty 0.4

## 2017-02-24 MED ORDER — LORAZEPAM 2 MG/ML IJ SOLN
1.0000 mg | Freq: Once | INTRAMUSCULAR | Status: AC
  Administered 2017-02-24: 1 mg via INTRAVENOUS
  Filled 2017-02-24: qty 1

## 2017-02-24 MED ORDER — ACETAMINOPHEN 325 MG PO TABS: 650.0000 mg | ORAL_TABLET | ORAL | Status: DC | PRN

## 2017-02-24 MED ORDER — ALPRAZOLAM 0.5 MG PO TABS: 0.5000 mg | ORAL_TABLET | Freq: Three times a day (TID) | ORAL | Status: DC | PRN

## 2017-02-24 MED ORDER — RAMIPRIL 10 MG PO CAPS
10.0000 mg | ORAL_CAPSULE | Freq: Two times a day (BID) | ORAL | Status: DC
  Administered 2017-02-25: 10 mg via ORAL
  Filled 2017-02-24: qty 2
  Filled 2017-02-24 (×2): qty 1
  Filled 2017-02-24: qty 2
  Filled 2017-02-24: qty 1

## 2017-02-24 MED ORDER — ASPIRIN EC 81 MG PO TBEC
81.0000 mg | DELAYED_RELEASE_TABLET | Freq: Every day | ORAL | Status: DC
  Administered 2017-02-25: 81 mg via ORAL
  Filled 2017-02-24: qty 1

## 2017-02-24 MED ORDER — AMLODIPINE BESYLATE 10 MG PO TABS
10.0000 mg | ORAL_TABLET | Freq: Every day | ORAL | Status: DC
  Administered 2017-02-25: 10 mg via ORAL
  Filled 2017-02-24: qty 1

## 2017-02-24 MED ORDER — ACETAMINOPHEN 160 MG/5ML PO SOLN: 650.0000 mg | ORAL | Status: DC | PRN

## 2017-02-24 MED ORDER — ACETAMINOPHEN 650 MG RE SUPP: 650.0000 mg | RECTAL | Status: DC | PRN

## 2017-02-24 MED ORDER — SODIUM CHLORIDE 0.9 % IV BOLUS (SEPSIS)
1000.0000 mL | Freq: Once | INTRAVENOUS | Status: AC
  Administered 2017-02-24: 1000 mL via INTRAVENOUS

## 2017-02-24 NOTE — ED Provider Notes (Signed)
[facility] DEPT Provider Note   CSN: [id number] Arrival date & time: 02/24/17  1257     History   Chief Complaint Chief Complaint  Patient presents with  . Shortness of Breath  . Near Syncope    HPI James Lang is a 66 y.o. male with past medical history HTN, HLD, prostate cancer, and anxiety who presents today with chief complaint acute onset of 2 episodes of lightheadedness which began earlier today. Patient states that he was outside in his shed working on a steel trailer at around 10:30 AM this morning when he experienced an episode of lightheadedness, generalized weakness, shortness of breath and a "wobbly feeling ". He states that he was able to make it inside to his home without difficulty, but this feeling persisted for 30 minutes, at which point he decided to be evaluated by his primary care physician who is also his cardiologist. He denies blurry vision or diplopia, but states that there were "bubbles" in his left peripheral field of vision. States that he was only outside for less than an hour, although in very hot conditions and he does not drink "nearly as much water as I should". At the cardiologist's office, he experienced an episode of acute onset of shortness of breath and resumption of lightheadedness as well as bilateral hand tingling, and he states that "I felt like I was slurring my words ". Patient's wife states that she noticed slurred speech and a possible left-sided facial droop. He denies syncope, falls, or vision changes. While at the primary care's office, he was noted to be hypertensive at 168/80, but not orthostatic. Denies chest pain, abdominal pain, nausea, vomiting, diarrhea, numbness, confusion. He has been ambulatory since these episodes without difficulty. He is a former smoker, and quit over 10 years ago. He denies recent travel or surgeries, no hemoptysis, not taking any testosterone replaced in therapy, and no prior history of DVT or PE. States he has  no significant cardiac history. The history is provided by the patient.     Past Medical History:  Diagnosis Date  . Anxiety   . Cancer ([place])   . Hyperlipidemia   . Hypertension   . Impotence of organic origin   . Personal history of malignant neoplasm of prostate   . Shortness of breath    with exertion    Patient Active Problem List   Diagnosis Date Noted  . Chest pain at rest 01/27/2014  . Right inguinal hernia 03/08/2013  . Hypertension 11/17/2011    Past Surgical History:  Procedure Laterality Date  . carpel tunnel Bilateral   . FOOT SURGERY Right 1970   Toes shot off. 1ST TOE REMAINS  . HERNIA REPAIR    . INGUINAL HERNIA REPAIR Right 03/11/2013   Procedure: HERNIA REPAIR INGUINAL ADULT;  Surgeon: Harl Bowie, MD;  Location: [facility];  Service: General;  Laterality: Right;  . INSERTION OF MESH Right 03/11/2013   Procedure: INSERTION OF MESH;  Surgeon: Harl Bowie, MD;  Location: [facility];  Service: General;  Laterality: Right;  . PROSTATE SURGERY  2005       Home Medications    Prior to Admission medications   Medication Sig Start Date End Date Taking? Authorizing Provider  ALPRAZolam Duanne Moron) 0.5 MG tablet Take 0.5 mg by mouth 3 (three) times daily as needed for anxiety.    Yes [provider]  amLODipine (NORVASC) 10 MG tablet Take 10 mg by mouth daily.   Yes [provider]  aspirin EC 81 MG tablet Take 81 mg by mouth daily.    Yes [provider]  budesonide-formoterol (SYMBICORT) 80-4.5 MCG/ACT inhaler Inhale 2 puffs into the lungs 2 (two) times daily as needed (wheezing).   Yes [provider]  ramipril (ALTACE) 10 MG capsule Take 10 mg by mouth 2 (two) times daily.    Yes [provider]    Family History History reviewed. No pertinent family history.  Social History Social History  Substance Use Topics  . Smoking status: Former Smoker    Years: 20.00    Types: Cigarettes    Quit date: 05/20/2003    . Smokeless tobacco: Never Used  . Alcohol use 6.0 oz/week    10 Cans of beer per week     Comment: Weekly.     Allergies   Codeine and Lipitor [atorvastatin]   Review of Systems Review of Systems  Constitutional: Negative for chills and fever.  Eyes: Negative for visual disturbance.  Respiratory: Positive for shortness of breath. Negative for cough.   Cardiovascular: Negative for chest pain, palpitations and leg swelling.  Gastrointestinal: Negative for abdominal pain, diarrhea, nausea and vomiting.  Musculoskeletal: Negative for gait problem.  Neurological: Positive for weakness and light-headedness. Negative for syncope, facial asymmetry and numbness.  Psychiatric/Behavioral: Negative for confusion.  All other systems reviewed and are negative.    Physical Exam Updated Vital Signs BP (!) 168/79   Pulse (!) 46   Temp 98.3 F (36.8 C) (Oral)   Resp 18   Ht 6\' 1"  (1.854 m)   Wt 112 kg (247 lb)   SpO2 95%   BMI 32.59 kg/m   Physical Exam  Constitutional: He is oriented to person, place, and time. He appears well-developed and well-nourished.  HENT:  Head: Normocephalic and atraumatic.  Eyes: Conjunctivae are normal. Pupils are equal, round, and reactive to light. Right eye exhibits no discharge. Left eye exhibits no discharge.  Neck: Normal range of motion. Neck supple. No JVD present. No tracheal deviation present.  Cardiovascular: Regular rhythm, normal heart sounds and intact distal pulses.   Distant heart sounds, bradycardic, 2+ radial and DP/PT pulses bl, negative Homan's bl   Pulmonary/Chest: Effort normal and breath sounds normal. No respiratory distress. He has no wheezes. He exhibits no tenderness.  Equal rise and fall of chest, no increased work of breathing  Abdominal: Soft. He exhibits no distension. There is no tenderness.  Musculoskeletal: Normal range of motion. He exhibits no edema.  No midline spine TTP, no paraspinal muscle tenderness. Normal  range of motion of extremities, and 5/5 strength of BUE and BLE major muscle groups. Good grip strength.  Neurological: He is alert and oriented to person, place, and time. No cranial nerve deficit or sensory deficit.  Cranial nerves III through XII tested and intact, fluent speech, no facial droop, no pronator drift. Sensation intact to soft touch of extremities. Normal gait, and patient able to heel walk and toe walk without difficulty.   Skin: Skin is warm and dry.  Psychiatric: He has a normal mood and affect. His behavior is normal.     ED Treatments / Results  Labs (all labs ordered are listed, but only abnormal results are displayed) Labs Reviewed  BASIC METABOLIC PANEL - Abnormal; Notable for the following:       Result Value   Glucose, Bld 104 (*)    All other components within normal limits  I-STAT CHEM 8, ED - Abnormal; Notable for the following:  Glucose, Bld 101 (*)    Calcium, Ion 1.12 (*)    All other components within normal limits  CBC  URINALYSIS, ROUTINE W REFLEX MICROSCOPIC  PROTIME-INR  APTT  DIFFERENTIAL  D-DIMER, QUANTITATIVE (NOT AT [facility])  CBG MONITORING, ED  I-STAT TROPOININ, ED  I-STAT TROPOININ, ED    EKG  EKG Interpretation  Date/Time:  Monday February 24 2017 13:02:02 EDT Ventricular Rate:  59 PR Interval:  158 QRS Duration: 118 QT Interval:  420 QTC Calculation: 415 R Axis:   -49 Text Interpretation:  Sinus bradycardia Left anterior fascicular block Left ventricular hypertrophy with QRS widening Cannot rule out Septal infarct , age undetermined Abnormal ECG No significant change since last tracing Confirmed by Theotis Burrow [phone]) on 02/24/2017 3:24:40 PM       Radiology Dg Chest 2 View  Result Date: 02/24/2017 CLINICAL DATA:  Dizziness and shortness of Breath EXAM: CHEST  2 VIEW COMPARISON:  01/27/2014 FINDINGS: Cardiac shadow is within normal limits. The lungs are well aerated bilaterally. No focal infiltrate or sizable effusion is  seen. Degenerative change thoracic spine is noted. IMPRESSION: No active cardiopulmonary disease. Electronically Signed   By: Inez Catalina M.D.   On: 02/24/2017 14:42   Ct Head Wo Contrast  Result Date: 02/24/2017 CLINICAL DATA:  Pt c/o dizziness and a little slurring of his speech today EXAM: CT HEAD WITHOUT CONTRAST TECHNIQUE: Contiguous axial images were obtained from the base of the skull through the vertex without intravenous contrast. COMPARISON:  Head CT dated 02/15/2004. FINDINGS: Brain: Ventricles are normal in size and configuration. All areas of the brain demonstrate normal gray-white matter attenuation. There is no mass, hemorrhage, edema or other evidence of acute parenchymal abnormality. No extra-axial hemorrhage. Vascular: There are chronic calcified atherosclerotic changes of the large vessels at the skull base. No unexpected hyperdense vessel. Skull: Normal. Negative for fracture or focal lesion. Sinuses/Orbits: Mild mucosal thickening within the left ethmoid air cells, likely chronic. Visualized upper paranasal sinuses are otherwise clear. Periorbital and retro-orbital soft tissues are unremarkable. Other: None. IMPRESSION: No acute findings.  No intracranial mass, hemorrhage or edema. Electronically Signed   By: Franki Cabot M.D.   On: 02/24/2017 15:06    Procedures Procedures (including critical care time)  Medications Ordered in ED Medications  sodium chloride 0.9 % bolus 1,000 mL (1,000 mLs Intravenous New Bag/Given 02/24/17 1609)     Initial Impression / Assessment and Plan / ED Course  I have reviewed the triage vital signs and the nursing notes.  Pertinent labs & imaging results that were available during my care of the patient were reviewed by me and considered in my medical decision making (see chart for details).     Patient with 2 episodes of shortness of breath, lightheadedness, generalized weakness earlier today, with associated visual changes and slurred speech  and possible facial droop. Afebrile, somewhat hypertensive while in the ED and bradycardic. Not orthostatic. No focal neurological deficits. At the time of evaluation, patient had no complaints and was resting comfortably. EKG shows no significant change from last, delta troponin negative. Low suspicion of ACS or MI with heart score 3. D-dimer negative, low suspicion of PE. Chest x-ray with no acute cardiopulmonary disease. Low suspicion of pneumonia, bronchitis, pleural effusion, or pericarditis. Remainder of lab work unremarkable. CT head shows no intracranial mass, hemorrhage or edema and no acute findings. History suspicious for TIA, consult to neurology who recommends admission for TIA workup overnight. Spoke with Dr. Doylene Canard, patient's PCP, who  agrees to assume care of patient and will bring him in for TIA workup.  Final Clinical Impressions(s) / ED Diagnoses   Final diagnoses:  Lightheaded  Shortness of breath    New Prescriptions New Prescriptions   No medications on file     Debroah Baller 02/24/17 1705    Little, Wenda Overland, MD 02/27/17 [phone]

## 2017-02-24 NOTE — ED Notes (Signed)
Patient transported to MRI 
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
## 2017-02-24 NOTE — ED Notes (Signed)
Attempted Report 

## 2017-02-24 NOTE — ED Notes (Signed)
   02/24/17 1812  Step #1 Pre-Swallow Screen  History of dysphagia No  Dysphagia diet prior to admission No  Awake and alert, responding to speech? Yes  Positioned upright, with some head control? Yes  Cough on command? Yes  Maintain control of their saliva? Yes  Lick top and bottom lip? Yes  Breathe freely and maintain O2 sats? Yes  Voice quality clear (No "wet" gurgly or hoarse sounds) Yes  R Upper  Breath Sounds Clear  L Upper Breath Sounds Clear  R Lower Breath Sounds Clear  L Lower Breath Sounds Clear  Step #2 Swallow Screen  Patient was kept strictly NPO prior to this screen Yes  Water via cup No problems  Water via straw No problems  Cracker No problems  Lung sounds changed after swallow screen No  Passed swallow screen Yes, see row information   Completed in ED

## 2017-02-24 NOTE — ED Notes (Signed)
Pt given a urinal.

## 2017-02-24 NOTE — ED Triage Notes (Signed)
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 He went to see his PCP and became very short of breath in the office as well as having some slurred speech. He is alert and oriented at this time, speech is normal.  No neuro deficits noted at this time.
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
## 2017-02-24 NOTE — ED Notes (Signed)
Patient transported to CT 

## 2017-02-25 ENCOUNTER — Observation Stay (HOSPITAL_BASED_OUTPATIENT_CLINIC_OR_DEPARTMENT_OTHER): Payer: BLUE CROSS/BLUE SHIELD

## 2017-02-25 ENCOUNTER — Observation Stay (HOSPITAL_COMMUNITY): Payer: BLUE CROSS/BLUE SHIELD

## 2017-02-25 DIAGNOSIS — G458 Other transient cerebral ischemic attacks and related syndromes: Secondary | ICD-10-CM

## 2017-02-25 LAB — ECHOCARDIOGRAM COMPLETE
AOASC: 36 cm
AVLVOTPG: 10 mmHg
CHL CUP DOP CALC LVOT VTI: 33.4 cm
CHL CUP LV S' LATERAL: 9.46 cm/s
CHL CUP STROKE VOLUME: 55 mL
E/e' ratio: 5.42
EWDT: 301 ms
FS: 37 % (ref 28–44)
HEIGHTINCHES: 73 in
IVS/LV PW RATIO, ED: 1.18
LA ID, A-P, ES: 42 mm
LA diam end sys: 42 mm
LA diam index: 1.7 cm/m2
LA vol A4C: 43.6 ml
LA vol: 42 mL
LAVOLIN: 17 mL/m2
LV E/e' medial: 5.42
LV E/e'average: 5.42
LV SIMPSON'S DISK: 60
LV TDI E'LATERAL: 13.5
LV TDI E'MEDIAL: 8.16
LV dias vol index: 37 mL/m2
LV dias vol: 91 mL (ref 62–150)
LV e' LATERAL: 13.5 cm/s
LVOT SV: 105 mL
LVOT area: 3.14 cm2
LVOT peak vel: 156 cm/s
LVOTD: 20 mm
LVSYSVOL: 36 mL (ref 21–61)
LVSYSVOLIN: 15 mL/m2
MV Dec: 301
MV Peak grad: 2 mmHg
MVAP: 0.89 cm2
MVPKAVEL: 75.2 m/s
MVPKEVEL: 73.2 m/s
MVSPHT: 247 ms
PW: 13.7 mm — AB (ref 0.6–1.1)
RV LATERAL S' VELOCITY: 12.4 cm/s
RV TAPSE: 27.5 mm
WEIGHTICAEL: 4052.8 [oz_av]

## 2017-02-25 LAB — LIPID PANEL
CHOL/HDL RATIO: 6.2 ratio
CHOLESTEROL: 160 mg/dL (ref 0–200)
HDL: 26 mg/dL — ABNORMAL LOW (ref >40)
LDL Cholesterol: 108 mg/dL — ABNORMAL HIGH (ref 0–99)
TRIGLYCERIDES: 132 mg/dL (ref <150)
VLDL: 26 mg/dL (ref 0–40)

## 2017-02-25 LAB — HIV ANTIBODY (ROUTINE TESTING W REFLEX): HIV SCREEN 4TH GENERATION: NONREACTIVE

## 2017-02-25 MED ORDER — ROSUVASTATIN CALCIUM 5 MG PO TABS
5.0000 mg | ORAL_TABLET | Freq: Every day | ORAL | Status: DC
  Administered 2017-02-25: 5 mg via ORAL
  Filled 2017-02-25: qty 1

## 2017-02-25 MED ORDER — ASPIRIN EC 325 MG PO TBEC: 325.0000 mg | DELAYED_RELEASE_TABLET | Freq: Every day | ORAL | Status: DC

## 2017-02-25 MED ORDER — ASPIRIN 325 MG PO TBEC: 325.0000 mg | DELAYED_RELEASE_TABLET | Freq: Every day | ORAL | Status: DC

## 2017-02-25 MED ORDER — ROSUVASTATIN CALCIUM 5 MG PO TABS: 5.0000 mg | ORAL_TABLET | Freq: Every day | ORAL | 3 refills | Status: AC

## 2017-02-25 NOTE — Progress Notes (Signed)
*  PRELIMINARY RESULTS* Vascular Ultrasound Carotid Duplex (Doppler) has been completed.  Findings suggest 1-39% internal carotid artery stenosis bilaterally. Vertebral arteries are patent with antegrade flow.  02/25/2017 9:49 AM Maudry Mayhew, BS, RVT, RDCS, RDMS

## 2017-02-25 NOTE — Progress Notes (Signed)
RN notified by CCMD patients HR 35-36. Patient assessed. Patient neuro intact. Patient denies pain, SOB, h/a. Patients apical pulse 42. RN will continue to monitor.

## 2017-02-25 NOTE — Progress Notes (Signed)
STROKE TEAM PROGRESS NOTE   HISTORY OF PRESENT ILLNESS (per record) James Lang is a 66 year old male with history of hypertension, obesity, hyperlipidemia and CA of prostate with surgery in 2005 who presented after two episodes of weakness, dysequilibrium, and shortness of breath on 02/24/2017.  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  The patient reports being short of breath and seeing "bubbles" in his left visual field during the first episode.  The patient reports being very hot and likely dehydrated at the time.  Within an hour of the first episode, he went to see his PCP, where he was negative for orthostatic hypotension.  While at his PCP, he had a second episode of weakness and shortness of breath, this time with slurred speech, and left facial droop.  At this time the patient endorsed bilateral tingling in his hands.  Patient was not administered IV t-PA secondary to resolution of symptoms on arrival.   He was admitted to General Neurology for further evaluation and treatment.   SUBJECTIVE (INTERVAL HISTORY) His wife is at the bedside.  The patient appears alert and oriented, and follows all commands appropriately.     OBJECTIVE Temp:  [97.9 F (36.6 C)-98.5 F (36.9 C)] 97.9 F (36.6 C) (06/26 1407) Pulse Rate:  [42-56] 56 (06/26 1407) Cardiac Rhythm: Sinus bradycardia (06/26 0827) Resp:  [10-21] 20 (06/26 1407) BP: (130-181)/(66-97) 131/66 (06/26 1407) SpO2:  [93 %-100 %] 97 % (06/26 1407) Weight:  [114.9 kg (253 lb 4.8 oz)] 114.9 kg (253 lb 4.8 oz) (06/25 2132)  CBC:  Recent Labs Lab 02/24/17 1312 02/24/17 1332  WBC 9.6  --   NEUTROABS 6.6  --   HGB 16.4 16.7  HCT 48.2 49.0  MCV 87.2  --   PLT 300  --     Basic Metabolic Panel:  Recent Labs Lab 02/24/17 1312 02/24/17 1332  NA 137 137  K 4.1 4.1  CL 103 101  CO2 25  --   GLUCOSE 104* 101*  BUN 14 16  CREATININE 1.18 1.10  CALCIUM 9.4   --     Lipid Panel:    Component Value Date/Time   CHOL 160 02/25/2017 0547   TRIG 132 02/25/2017 0547   HDL 26 (L) 02/25/2017 0547   CHOLHDL 6.2 02/25/2017 0547   VLDL 26 02/25/2017 0547   LDLCALC 108 (H) 02/25/2017 0547   HgbA1c: No results found for: HGBA1C Urine Drug Screen: No results found for: LABOPIA, COCAINSCRNUR, LABBENZ, AMPHETMU, THCU, LABBARB  Alcohol Level No results found for: [facility]  IMAGING  Dg Eye Foreign Body 02/24/2017 IMPRESSION: No evidence of metallic foreign body within the orbits.  Dg Chest 2 View 02/24/2017 IMPRESSION: No active cardiopulmonary disease.  Ct Head Wo Contrast 02/24/2017 IMPRESSION: No acute findings.  No intracranial mass, hemorrhage or edema.  Mr Brain Wo Contrast 02/24/2017 IMPRESSION: Normal brain MRI. No acute intracranial infarct or other process identified.  Mr Jodene Nam Head/brain Wo Cm 02/24/2017 IMPRESSION: Normal intracranial MRA.     PHYSICAL EXAM Pleasant middle-age male currently not in distress.  . Afebrile. Head is nontraumatic. Neck is supple without bruit.    Cardiac exam no murmur or gallop. Lungs are clear to auscultation. Distal pulses are well felt. Neurological Exam ;  Awake  Alert oriented x 3. Normal speech and language.eye movements full without nystagmus.fundi were not visualized. Vision acuity and fields appear normal. Hearing is normal. Palatal movements  are normal. Face symmetric. Tongue midline. Normal strength, tone, reflexes and coordination. Normal sensation. Gait deferred.  ASSESSMENT/PLAN Mr. James Lang is a 66 y.o. male with history of hypertension, obesity, hyperlipidemia and CA of prostate with surgery in 2005 presenting with two episodes of weakness, dysequilibrium, and shortness of breath on 02/24/2017. He did not receive IV t-PA due to resolution of symptoms on arrival.   TIA: of unknown etiology  Resultant  no deficits  CT head: no stroke  MRI head: no stroke  MRA head: no  stroke  Carotid Doppler:  -39% internal carotid artery stenosis bilaterally. Vertebral arteries are patent with antegrade flow   2D Echo: EF 60-65%. No source of embolus   LDL 108  HgbA1c pending   SCDs for VTE prophylaxis  Diet Heart Room service appropriate? Yes; Fluid consistency: Thin  aspirin 81 mg daily prior to admission, now on aspirin 325 mg daily  Patient counseled to be compliant with his antithrombotic medications  Ongoing aggressive stroke risk factor management  Therapy recommendations: None  Disposition:  Pending   Hypertension  Stable Permissive hypertension (OK if < 220/120) but gradually normalize in 5-7 days Long-term BP goal normotensive  Hyperlipidemia  Home meds: none  LDL 108, goal < 70  Statin not added due to documented allergy to statins  Diabetes  HgbA1c pending  goal < 7.0  Controlled  Other Stroke Risk Factors  Advanced age  ETOH use, advised to drink no more than 2 drink(s) a day  Obesity, Body mass index is 33.42 kg/m., recommend weight loss, diet and exercise as appropriate  History of prostate cancer  Other Active Problems  None  Hospital day # 0  I have personally examined this patient, reviewed notes, independently viewed imaging studies, participated in medical decision making and plan of care.ROS completed by me personally and pertinent positives fully documented  I have made any additions or clarifications directly to the above note. He presented with 2 transient episodes of sudden onset of shortness of breath followed by visual dysfunction in the first and some slurred speech and facial droop in the second episode. Episodes are of unclear significance possible TIAs. Continue ongoing stroke workup. Check transcranial Doppler bubble study for PFO. Long discussion with the patient and wife at the bedside and answered questions. Greater than 50% time during this 35 minute visit was spent on counseling and coordination of  care about TIAs and stroke prevention, evaluation and treatment discussion.  James Contras, MD Medical Director [facility] Pager: [phone] 02/25/2017 6:03 PM   To contact Stroke Continuity provider, please refer to [email or web address] After hours, contact General Neurology

## 2017-02-25 NOTE — Evaluation (Signed)
Physical Therapy Evaluation and Discharge Patient Details Name: James Lang MRN: [id number] DOB: 05/23/1951 Today's Date: 02/25/2017   History of Present Illness  Pt is a 66 y/o male admitted secondary to dizziness and slurred speech. Imaging results negative for acute abnormality. PMH includes prostate cancer s/p prostate surgery, anxiety, HTN, and R foot surgery on R foot; pt only has big toe on R foot.   Clinical Impression  Patient evaluated by Physical Therapy with no further acute PT needs identified. All education has been completed and the patient has no further questions. Pt supervision for dynamic balance activities with gait, with no apparent balance deficits. Pt reports he is at baseline with mobility, however, did have some visual deficit concerns. Spoke with OT and OT will assess further. See below for any follow-up Physical Therapy or equipment needs. PT is signing off. Thank you for this referral. If needs change, please re-consult.      Follow Up Recommendations No PT follow up    Equipment Recommendations  None recommended by PT    Recommendations for Other Services       Precautions / Restrictions Precautions Precautions: None Restrictions Weight Bearing Restrictions: No      Mobility  Bed Mobility               General bed mobility comments: In chair upon entry   Transfers Overall transfer level: Modified independent               General transfer comment: No external assist required   Ambulation/Gait Ambulation/Gait assistance: Supervision Ambulation Distance (Feet): 150 Feet Assistive device: None Gait Pattern/deviations: Step-through pattern;Decreased stride length Gait velocity: WNL Gait velocity interpretation: at or above normal speed for age/gender General Gait Details: No balance deficits noted during dynamic balance activities. Supervision for safety.   Stairs Stairs: Yes Stairs assistance: Supervision Stair Management:  One rail Right;Alternating pattern;Step to pattern;Forwards Number of Stairs: 5 General stair comments: Alternating pattern for ascending stairs and step to pattern for descending stairs. Demonstrated safe technique and no balance deficits noted.   Wheelchair Mobility    Modified Rankin (Stroke Patients Only)       Balance Overall balance assessment: Needs assistance Sitting-balance support: No upper extremity supported;Feet supported Sitting balance-Leahy Scale: Good     Standing balance support: No upper extremity supported Standing balance-Leahy Scale: Good                   Standardized Balance Assessment Standardized Balance Assessment : Dynamic Gait Index   Dynamic Gait Index Level Surface: Normal Change in Gait Speed: Normal Gait with Horizontal Head Turns: Normal Gait with Vertical Head Turns: Normal Gait and Pivot Turn: Mild Impairment Step Over Obstacle: Normal Step Around Obstacles: Normal Steps: Mild Impairment Total Score: 22       Pertinent Vitals/Pain Pain Assessment: No/denies pain    Home Living Family/patient expects to be discharged to:: Private residence Living Arrangements: Spouse/significant other Available Help at Discharge: Family;Available 24 hours/day Type of Home: House Home Access: Stairs to enter Entrance Stairs-Rails: Left;Can reach Advertising account executive of Steps: 5 Home Layout: One level Home Equipment: Shower seat;Cane - single point;Crutches      Prior Function Level of Independence: Independent         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      [employer]   Dominant Hand: Right    Extremity/Trunk Assessment   Upper Extremity Assessment Upper Extremity Assessment: Overall WFL for tasks assessed  Lower Extremity Assessment Lower Extremity Assessment: LLE deficits/detail;Overall WFL for tasks assessed LLE Deficits / Details: Bottom of foot goes numb occasionally since getting here      Cervical / Trunk Assessment Cervical / Trunk Assessment: Normal  Communication   Communication: No difficulties  Cognition Arousal/Alertness: Awake/alert Behavior During Therapy: WFL for tasks assessed/performed Overall Cognitive Status: Within Functional Limits for tasks assessed                                        General Comments General comments (skin integrity, edema, etc.): Pt reporting some double vision and "tiredness" in eyes with visual tracking. No nystagmus noted with visual tracking. Spoke with OT and OT will assess further.     Exercises     Assessment/Plan    PT Assessment Patent does not need any further PT services  PT Problem List         PT Treatment Interventions      PT Goals (Current goals can be found in the Care Plan section)  Acute Rehab PT Goals Patient Stated Goal: to figure out what is wrong with my eyes  PT Goal Formulation: With patient Time For Goal Achievement: 02/25/17 Potential to Achieve Goals: Good    Frequency     Barriers to discharge        Co-evaluation               AM-PAC PT "6 Clicks" Daily Activity  Outcome Measure Difficulty turning over in bed (including adjusting bedclothes, sheets and blankets)?: None Difficulty moving from lying on back to sitting on the side of the bed? : None Difficulty sitting down on and standing up from a chair with arms (e.g., wheelchair, bedside commode, etc,.)?: None Help needed moving to and from a bed to chair (including a wheelchair)?: None Help needed walking in hospital room?: None Help needed climbing 3-5 steps with a railing? : None 6 Click Score: 24    End of Session Equipment Utilized During Treatment: Gait belt Activity Tolerance: Patient tolerated treatment well Patient left: in chair;with call bell/phone within reach Nurse Communication: Mobility status PT Visit Diagnosis: Other symptoms and signs involving the nervous system (R29.898)    Time:  0240-9735 PT Time Calculation (min) (ACUTE ONLY): 18 min   Charges:   PT Evaluation $PT Eval Low Complexity: 1 Procedure PT Treatments $Neuromuscular Re-education: 8-22 mins   PT G Codes:   PT G-Codes **NOT FOR INPATIENT CLASS** Functional Assessment Tool Used: AM-PAC 6 Clicks Basic Mobility;Clinical judgement Functional Limitation: Mobility: Walking and moving around Mobility: Walking and Moving Around Current Status (H2992): 0 percent impaired, limited or restricted Mobility: Walking and Moving Around Goal Status (E2683): 0 percent impaired, limited or restricted Mobility: Walking and Moving Around Discharge Status (M1962): 0 percent impaired, limited or restricted    Leighton Ruff, PT, DPT  Acute Rehabilitation Services  Pager: Between 02/25/2017, 1:12 PM

## 2017-02-25 NOTE — Progress Notes (Signed)
Patient is HD#2 s/p weakness, dysequilibrium and shortness of breath. Discharge instructions and education done; PIV discontinued, patient discharged via w/c accompanied by spouse.

## 2017-02-25 NOTE — Discharge Summary (Signed)
Physician Discharge Summary  Patient ID: James Lang MRN: [id number] DOB/AGE: 65/10/1950 65 y.o.  Admit date: 02/24/2017 Discharge date: 02/25/2017  Admission Diagnoses: TIA r/o stroke Shortness of breath Hypertension Obesity Anxiety  Discharge Diagnoses:  Principle Problem: TIA (transient ischemic attack) Active Problems:   Shortness of breath   Hypertension   Obesity   Anxiety   Hyperlipidemia  Discharged Condition: fair  Hospital Course: 66 year old male with hypertension, obesity, hyperlipidemia and CA prostate with surgery in 2005 had weakness, shortness of breath and speech disturbance lasting for few minutes. He also felt he has trouble focusing vision at times. His CT scan of head, MRI, MRA brain, carotid doppler, echocardiogram are unremarkable. His aspirin was increased to 325 mg. and small dose of rosuvastatin was started. He wants to postpone bubble study for PFO for now. He will see me in 1 week.  Consults: cardiology and neurology  Significant Diagnostic Studies: labs: CBC, BMET, D-dimer were unremarkable. Total cholesterol was 160 and LDL cholesterol was 108 mg/dL.  Chest x-ray: unremarkable.  EKG: SR, LVH, LAHB.  Echocardiogram: Normal LV systolic function. Mildly dilated aortic root and ascending aorta.  CT head: No acute mass, hemorrhage or edema.  MRI/MRA brain : unremarkable.  Treatments: cardiac meds: ramipril (Altace), aspirin, amlodipine and rosuvastatin.  Discharge Exam: Blood pressure 131/66, pulse (!) 56, temperature 97.9 F (36.6 C), temperature source Oral, resp. rate 20, height 6\' 1"  (1.854 m), weight 114.9 kg (253 lb 4.8 oz), SpO2 97 %. General appearance: alert, cooperative and appears stated age. Head: Normocephalic, atraumatic. Eyes: Hazel eyes, pink conjunctiva, corneas clear. PERRL, EOM's intact.  Neck: No adenopathy, no carotid bruit, no JVD, supple, symmetrical, trachea midline and thyroid not enlarged. Resp: Clear to  auscultation bilaterally. Cardio: Regular rate and rhythm, S1, S2 normal, II/VI systolic murmur, no click, rub or gallop. GI: Soft, non-tender; bowel sounds normal; no organomegaly. Extremities: No edema, cyanosis or clubbing. Skin: Warm and dry.  Neurologic: Alert and oriented X 3, normal strength and tone. Normal coordination and gait.  Disposition: 01-Home or Self Care   Allergies as of 02/25/2017      Reactions   Codeine Nausea Only   Lipitor [atorvastatin] Other (See Comments)   Numbness on left side of body      Medication List    TAKE these medications   ALPRAZolam 0.5 MG tablet Commonly known as:  XANAX Take 0.5 mg by mouth 3 (three) times daily as needed for anxiety.   amLODipine 10 MG tablet Commonly known as:  NORVASC Take 10 mg by mouth daily.   aspirin 325 MG EC tablet Take 1 tablet (325 mg total) by mouth daily. Start taking on:  02/26/2017 What changed:  medication strength  how much to take   budesonide-formoterol 80-4.5 MCG/ACT inhaler Commonly known as:  SYMBICORT Inhale 2 puffs into the lungs 2 (two) times daily as needed (wheezing).   ramipril 10 MG capsule Commonly known as:  ALTACE Take 10 mg by mouth 2 (two) times daily.   rosuvastatin 5 MG tablet Commonly known as:  CRESTOR Take 1 tablet (5 mg total) by mouth daily at 6 PM.      Follow-up Information    Dixie Dials, MD. Schedule an appointment as soon as possible for a visit in 1 week(s).   Specialty:  Cardiology Contact information: [place] [address] [phone]           Signed: Birdie Riddle 02/25/2017, 6:54 PM

## 2017-02-25 NOTE — Evaluation (Addendum)
Occupational Therapy Evaluation/Discharge Patient Details Name: James Lang MRN: [id number] DOB: 09/17/1950 Today's Date: 02/25/2017    History of Present Illness Pt is a 66 y/o male admitted secondary to dizziness and slurred speech. Imaging results negative for acute abnormality. PMH includes prostate cancer s/p prostate surgery, anxiety, HTN, and R foot surgery on R foot; pt only has big toe on R foot.    Clinical Impression   Pt presenting with decreased functional use of vision impacting his ability to participate in IADL tasks. Pt presents with blurred vision in B eyes (L more affected than R) with greatest deficits in L inferior quadrant and decreased ability to focus on objects after eye movement. Additionally, pt with increasing eye fatigue during assessment. Educated pt on compensatory strategies to improve functional use of vision to maximize independence with IADL and recommended that pt see an opthamologist for full visual assessment. He is able to complete basic ADL independently in hospital setting and feel is able to function safely. He will have intermittent assistance at home if needed. No further acute OT needs identified and OT will sign off.     Follow Up Recommendations  No OT follow up    Equipment Recommendations  None recommended by OT    Recommendations for Other Services       Precautions / Restrictions Precautions Precautions: None Restrictions Weight Bearing Restrictions: No      Mobility Bed Mobility               General bed mobility comments: In chair upon entry   Transfers Overall transfer level: Independent               General transfer comment: No external assist required     Balance Overall balance assessment: Needs assistance Sitting-balance support: No upper extremity supported;Feet supported Sitting balance-Leahy Scale: Good     Standing balance support: No upper extremity supported;During functional  activity Standing balance-Leahy Scale: Good                   Standardized Balance Assessment Standardized Balance Assessment : Dynamic Gait Index   Dynamic Gait Index Level Surface: Normal Change in Gait Speed: Normal Gait with Horizontal Head Turns: Normal Gait with Vertical Head Turns: Normal Gait and Pivot Turn: Mild Impairment Step Over Obstacle: Normal Step Around Obstacles: Normal Steps: Mild Impairment Total Score: 22     ADL either performed or assessed with clinical judgement   ADL Overall ADL's : Independent                                       General ADL Comments: Able to complete basic ADL independently in hospital setting. IADL participation slightly impacted by visual deficits but pt able to compensate well.     Vision Baseline Vision/History: No visual deficits Patient Visual Report: Blurring of vision;Other (comment) (difficulty focusing) Vision Assessment?: Yes Eye Alignment: Within Functional Limits Ocular Range of Motion: Within Functional Limits Alignment/Gaze Preference: Within Defined Limits Tracking/Visual Pursuits: Other (comment) (blurry vision when tracking to the L) Saccades: Other (comment) Visual Fields: Other (comment) (Blurry in L inferior field) Additional Comments: Pt reporting increased time required for focusing on objects. Saccades in tact with multiple activities completed and noted increased fatigue as assessment progressed. Pt initially reporting to PT that he was experiencing double vision but no double vision reported to OT. Pt with notable increase in  blurry vision with L eye compared to R and greatest difficulty in superior quadrant. Visual deficits impacting his ability to complete IADL and work related tasks. Additionally noted pt squinting to focus.     Perception     Praxis      Pertinent Vitals/Pain Pain Assessment: No/denies pain     Hand Dominance Right   Extremity/Trunk Assessment Upper  Extremity Assessment Upper Extremity Assessment: Overall WFL for tasks assessed   Lower Extremity Assessment Lower Extremity Assessment: Defer to PT evaluation LLE Deficits / Details: Bottom of foot goes numb occasionally since getting here    Cervical / Trunk Assessment Cervical / Trunk Assessment: Normal   Communication Communication Communication: No difficulties   Cognition Arousal/Alertness: Awake/alert Behavior During Therapy: WFL for tasks assessed/performed Overall Cognitive Status: Within Functional Limits for tasks assessed                                     General Comments  Pt reporting some double vision and "tiredness" in eyes with visual tracking. No nystagmus noted with visual tracking. Spoke with OT and OT will assess further.     Exercises     Shoulder Instructions      Home Living Family/patient expects to be discharged to:: Private residence Living Arrangements: Spouse/significant other Available Help at Discharge: Family;Available 24 hours/day Type of Home: House Home Access: Stairs to enter [employer] of Steps: 5 Entrance Stairs-Rails: Left;Can reach both;Right Home Layout: One level     Bathroom Shower/Tub: Tub/shower unit;Walk-in shower   Bathroom Toilet: Handicapped height     Home Equipment: Marine scientist - single point;Crutches          Prior Functioning/Environment Level of Independence: Independent        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         OT Problem List: Impaired vision/perception      OT Treatment/Interventions:      OT Goals(Current goals can be found in the care plan section) Acute Rehab OT Goals Patient Stated Goal: to figure out what is wrong with my eyes  OT Goal Formulation: With patient Time For Goal Achievement: 03/11/17 Potential to Achieve Goals: Good  OT Frequency:     Barriers to D/C:            Co-evaluation              AM-PAC PT "6 Clicks"  Daily Activity     Outcome Measure Help from another person eating meals?: None Help from another person taking care of personal grooming?: None Help from another person toileting, which includes using toliet, bedpan, or urinal?: None Help from another person bathing (including washing, rinsing, drying)?: None Help from another person to put on and taking off regular upper body clothing?: None Help from another person to put on and taking off regular lower body clothing?: None 6 Click Score: 24   End of Session    Activity Tolerance: Patient tolerated treatment well Patient left: in chair;with call bell/phone within reach  OT Visit Diagnosis: Low vision, both eyes (H54.2)                Time: 5188-4166 OT Time Calculation (min): 27 min Charges:  OT General Charges $OT Visit: 1 Procedure OT Evaluation $OT Eval Low Complexity: 1 Procedure OT Treatments $Therapeutic Activity: 8-22 mins G-Codes: OT G-codes **NOT FOR INPATIENT CLASS** Functional Assessment Tool Used: Clinical judgement  Functional Limitation: Self care Self Care Current Status [phone]): 0 percent impaired, limited or restricted Self Care Goal Status (Z5612): 0 percent impaired, limited or restricted Self Care Discharge Status [phone]): 0 percent impaired, limited or restricted   Norman Herrlich, MS OTR/L  Pager: [place] 02/25/2017, 1:37 PM

## 2017-02-25 NOTE — Progress Notes (Signed)
  Echocardiogram 2D Echocardiogram has been performed.  Bobbye Charleston 02/25/2017, 10:32 AM

## 2017-02-27 LAB — VAS US CAROTID
LCCADDIAS: -12 cm/s
LCCAPSYS: 107 cm/s
LEFT ECA DIAS: -4 cm/s
LEFT VERTEBRAL DIAS: 9 cm/s
LICADDIAS: -19 cm/s
LICADSYS: -73 cm/s
LICAPSYS: -45 cm/s
Left CCA dist sys: -69 cm/s
Left CCA prox dias: 14 cm/s
Left ICA prox dias: -8 cm/s
RIGHT ECA DIAS: -7 cm/s
RIGHT VERTEBRAL DIAS: 6 cm/s
Right CCA prox dias: 15 cm/s
Right CCA prox sys: 98 cm/s
Right cca dist sys: -73 cm/s

## 2017-02-28 LAB — HEMOGLOBIN A1C
Hgb A1c MFr Bld: 5.7 % — ABNORMAL HIGH (ref 4.8–5.6)
Mean Plasma Glucose: 117 mg/dL

## 2017-12-03 ENCOUNTER — Other Ambulatory Visit: Payer: Self-pay | Admitting: Physician Assistant

## 2018-07-23 ENCOUNTER — Other Ambulatory Visit: Payer: Self-pay | Admitting: Surgery

## 2018-07-28 ENCOUNTER — Encounter (HOSPITAL_BASED_OUTPATIENT_CLINIC_OR_DEPARTMENT_OTHER): Payer: Self-pay

## 2018-07-28 ENCOUNTER — Other Ambulatory Visit: Payer: Self-pay

## 2018-08-03 NOTE — Progress Notes (Signed)
Pre surgical Ensure and CHG soap given to pt along with instructions for his surgery 08/06/18.

## 2018-08-05 NOTE — H&P (Signed)
James Lang Documented: 07/23/2018 10:39 AM Location: [facility] Patient #: [phone] DOB: 10-16-1950 Married / Language: Cleophus Molt / Race: White Male   History of Present Illness (Arlee Bossard A. Ninfa Linden MD; 07/23/2018 10:57 AM) The patient is a 67 year old male who presents with an inguinal hernia. This gentleman is here for a possible recurrent right hernia. I performed a open right inguinal hernia repair with mesh on him back in 2014. I could not do an laparoscopic repair because of a previous midline incision. He reports that over the past several months has been having increasing discomfort and noticed a bulge in the inguinal area on the right side. The pain is mild to moderate. It is worse when standing all day. He has had no obstructive symptoms. He is otherwise without complaints.   Past Surgical History (April Staton, [place]; 07/23/2018 10:39 AM) TURP   Diagnostic Studies History (April Staton, [place]; 07/23/2018 10:39 AM) Colonoscopy  1-5 years ago  Allergies (April Staton, CMA; 07/23/2018 10:40 AM) No Known Drug Allergies [07/23/2018]:  Medication History (April Staton, CMA; 07/23/2018 10:40 AM) Rosuvastatin Calcium (10MG  Tablet, Oral) Active. Ramipril (10MG  Capsule, Oral) Active. amLODIPine Besylate (5MG  Tablet, Oral) Active. Aspirin (325MG  Tablet, Oral) Active. Medications Reconciled  Social History (April Staton, CMA; 07/23/2018 10:39 AM) Alcohol use  Occasional alcohol use. Caffeine use  Coffee, Tea. Tobacco use  Former smoker.  Family History (April Staton, [place]; 07/23/2018 10:39 AM) Breast Cancer  Sister. Heart Disease  Father.  Other Problems (April Staton, CMA; 07/23/2018 10:39 AM) Anxiety Disorder  High blood pressure  Prostate Cancer     Review of Systems (April Staton CMA; 07/23/2018 10:39 AM) General Not Present- Appetite Loss, Chills, Fatigue, Fever, Night Sweats, Weight Gain and Weight Loss. Skin Not Present- Change  in Wart/Mole, Dryness, Hives, Jaundice, New Lesions, Non-Healing Wounds, Rash and Ulcer. HEENT Not Present- Earache, Hearing Loss, Hoarseness, Nose Bleed, Oral Ulcers, Ringing in the Ears, Seasonal Allergies, Sinus Pain, Sore Throat, Visual Disturbances, Wears glasses/contact lenses and Yellow Eyes. Respiratory Not Present- Bloody sputum, Chronic Cough, Difficulty Breathing, Snoring and Wheezing. Breast Not Present- Breast Mass, Breast Pain, Nipple Discharge and Skin Changes. Cardiovascular Not Present- Chest Pain, Difficulty Breathing Lying Down, Leg Cramps, Palpitations, Rapid Heart Rate, Shortness of Breath and Swelling of Extremities. Gastrointestinal Not Present- Abdominal Pain, Bloating, Bloody Stool, Change in Bowel Habits, Chronic diarrhea, Constipation, Difficulty Swallowing, Excessive gas, Gets full quickly at meals, Hemorrhoids, Indigestion, Nausea, Rectal Pain and Vomiting. Male Genitourinary Not Present- Blood in Urine, Change in Urinary Stream, Frequency, Impotence, Nocturia, Painful Urination, Urgency and Urine Leakage. Musculoskeletal Not Present- Back Pain, Joint Pain, Joint Stiffness, Muscle Pain, Muscle Weakness and Swelling of Extremities. Neurological Not Present- Decreased Memory, Fainting, Headaches, Numbness, Seizures, Tingling, Tremor, Trouble walking and Weakness. Psychiatric Present- Anxiety. Not Present- Bipolar, Change in Sleep Pattern, Depression, Fearful and Frequent crying. Endocrine Not Present- Cold Intolerance, Excessive Hunger, Hair Changes, Heat Intolerance, Hot flashes and New Diabetes. Hematology Not Present- Blood Thinners, Easy Bruising, Excessive bleeding, Gland problems, HIV and Persistent Infections.  Vitals (April Staton CMA; 07/23/2018 10:41 AM) 07/23/2018 10:41 AM Weight: 243.25 lb Height: 66in Body Surface Area: 2.17 m Body Mass Index: 39.26 kg/m  Temp.: 98.74F(Oral)  Pulse: 57 (Regular)  BP: 134/88 (Sitting, Left Arm,  Standard)       Physical Exam (Lodie Waheed A. Ninfa Linden MD; 07/23/2018 10:57 AM) The physical exam findings are as follows: Note:Generally he is well appearance Lungs are clear bilaterally Cardiovascular regular rate and rhythm Abdomen  is soft. There is recurrent, easily reducible right inguinal hernia without evidence of left inguinal hernia    Assessment & Plan (Sharia Averitt A. Ninfa Linden MD; 07/23/2018 10:59 AM) RECURRENT RIGHT INGUINAL HERNIA (K40.91) Impression: We discussed recurrence of an inguinal hernia. We discussed again proceeding with repair with mesh which he is interested in. Again, he is not a candidate for laparoscopic repair which I do preperitoneal. We would do another open repair with mesh. I discussed the risk which includes but is not limited to bleeding, infection, injury to surrounding structures including the testicular cord, chronic pain, hernia recurrence, use of mesh, postoperative recovery, etc. He understands and wishes to proceed with surgery which will be scheduled

## 2018-08-06 ENCOUNTER — Other Ambulatory Visit: Payer: Self-pay

## 2018-08-06 ENCOUNTER — Ambulatory Visit (HOSPITAL_BASED_OUTPATIENT_CLINIC_OR_DEPARTMENT_OTHER): Payer: Medicare HMO | Admitting: Anesthesiology

## 2018-08-06 ENCOUNTER — Encounter (HOSPITAL_BASED_OUTPATIENT_CLINIC_OR_DEPARTMENT_OTHER): Payer: Self-pay | Admitting: *Deleted

## 2018-08-06 ENCOUNTER — Ambulatory Visit (HOSPITAL_BASED_OUTPATIENT_CLINIC_OR_DEPARTMENT_OTHER)
Admission: RE | Admit: 2018-08-06 | Discharge: 2018-08-06 | Disposition: A | Discharge status: Home / Self Care | Payer: Medicare HMO | Source: Ambulatory Visit | Attending: Surgery | Admitting: Surgery

## 2018-08-06 ENCOUNTER — Encounter (HOSPITAL_BASED_OUTPATIENT_CLINIC_OR_DEPARTMENT_OTHER)
Admission: RE | Disposition: A | Discharge status: Home / Self Care | Payer: Self-pay | Source: Ambulatory Visit | Attending: Surgery

## 2018-08-06 DIAGNOSIS — Z87891 Personal history of nicotine dependence: Secondary | ICD-10-CM | POA: Insufficient documentation

## 2018-08-06 DIAGNOSIS — Z8546 Personal history of malignant neoplasm of prostate: Secondary | ICD-10-CM | POA: Insufficient documentation

## 2018-08-06 DIAGNOSIS — Z79899 Other long term (current) drug therapy: Secondary | ICD-10-CM | POA: Insufficient documentation

## 2018-08-06 DIAGNOSIS — F419 Anxiety disorder, unspecified: Secondary | ICD-10-CM | POA: Insufficient documentation

## 2018-08-06 DIAGNOSIS — I10 Essential (primary) hypertension: Secondary | ICD-10-CM | POA: Insufficient documentation

## 2018-08-06 DIAGNOSIS — Z7982 Long term (current) use of aspirin: Secondary | ICD-10-CM | POA: Insufficient documentation

## 2018-08-06 DIAGNOSIS — K4091 Unilateral inguinal hernia, without obstruction or gangrene, recurrent: Secondary | ICD-10-CM | POA: Insufficient documentation

## 2018-08-06 HISTORY — PX: INSERTION OF MESH: SHX5868

## 2018-08-06 HISTORY — PX: INGUINAL HERNIA REPAIR: SHX194

## 2018-08-06 SURGERY — REPAIR, HERNIA, INGUINAL, ADULT
Anesthesia: General | Site: Groin | Laterality: Right

## 2018-08-06 MED ORDER — BUPIVACAINE LIPOSOME 1.3 % IJ SUSP
INTRAMUSCULAR | Status: DC | PRN
  Administered 2018-08-06: 10 mL

## 2018-08-06 MED ORDER — LIDOCAINE 2% (20 MG/ML) 5 ML SYRINGE
INTRAMUSCULAR | Status: DC | PRN
  Administered 2018-08-06: 60 mg via INTRAVENOUS

## 2018-08-06 MED ORDER — ONDANSETRON HCL 4 MG/2ML IJ SOLN
INTRAMUSCULAR | Status: DC | PRN
  Administered 2018-08-06: 4 mg via INTRAVENOUS

## 2018-08-06 MED ORDER — CEFAZOLIN SODIUM-DEXTROSE 2-4 GM/100ML-% IV SOLN
2.0000 g | INTRAVENOUS | Status: AC
  Administered 2018-08-06: 2 g via INTRAVENOUS

## 2018-08-06 MED ORDER — PROPOFOL 10 MG/ML IV BOLUS
INTRAVENOUS | Status: AC
  Filled 2018-08-06: qty 20

## 2018-08-06 MED ORDER — LIDOCAINE 2% (20 MG/ML) 5 ML SYRINGE
INTRAMUSCULAR | Status: AC
  Filled 2018-08-06: qty 5

## 2018-08-06 MED ORDER — CHLORHEXIDINE GLUCONATE CLOTH 2 % EX PADS: 6.0000 | MEDICATED_PAD | Freq: Once | CUTANEOUS | Status: DC

## 2018-08-06 MED ORDER — ACETAMINOPHEN 500 MG PO TABS
ORAL_TABLET | ORAL | Status: AC
  Filled 2018-08-06: qty 2

## 2018-08-06 MED ORDER — PROPOFOL 10 MG/ML IV BOLUS
INTRAVENOUS | Status: DC | PRN
  Administered 2018-08-06: 200 mg via INTRAVENOUS

## 2018-08-06 MED ORDER — FENTANYL CITRATE (PF) 100 MCG/2ML IJ SOLN
INTRAMUSCULAR | Status: DC | PRN
  Administered 2018-08-06 (×2): 25 ug via INTRAVENOUS
  Administered 2018-08-06: 50 ug via INTRAVENOUS

## 2018-08-06 MED ORDER — MIDAZOLAM HCL 2 MG/2ML IJ SOLN
INTRAMUSCULAR | Status: AC
  Filled 2018-08-06: qty 2

## 2018-08-06 MED ORDER — FENTANYL CITRATE (PF) 100 MCG/2ML IJ SOLN
50.0000 ug | INTRAMUSCULAR | Status: DC | PRN
  Administered 2018-08-06: 50 ug via INTRAVENOUS

## 2018-08-06 MED ORDER — ACETAMINOPHEN 500 MG PO TABS
1000.0000 mg | ORAL_TABLET | ORAL | Status: AC
  Administered 2018-08-06: 1000 mg via ORAL

## 2018-08-06 MED ORDER — ONDANSETRON HCL 4 MG/2ML IJ SOLN
INTRAMUSCULAR | Status: AC
  Filled 2018-08-06: qty 2

## 2018-08-06 MED ORDER — ONDANSETRON HCL 4 MG/2ML IJ SOLN: 4.0000 mg | Freq: Once | INTRAMUSCULAR | Status: DC | PRN

## 2018-08-06 MED ORDER — GABAPENTIN 300 MG PO CAPS
ORAL_CAPSULE | ORAL | Status: AC
  Filled 2018-08-06: qty 1

## 2018-08-06 MED ORDER — CEFAZOLIN SODIUM-DEXTROSE 2-4 GM/100ML-% IV SOLN
INTRAVENOUS | Status: AC
  Filled 2018-08-06: qty 100

## 2018-08-06 MED ORDER — BUPIVACAINE HCL (PF) 0.5 % IJ SOLN
INTRAMUSCULAR | Status: DC | PRN
  Administered 2018-08-06: 20 mL

## 2018-08-06 MED ORDER — BUPIVACAINE-EPINEPHRINE (PF) 0.25% -1:200000 IJ SOLN
INTRAMUSCULAR | Status: AC
  Filled 2018-08-06: qty 30

## 2018-08-06 MED ORDER — EPHEDRINE SULFATE-NACL 50-0.9 MG/10ML-% IV SOSY
PREFILLED_SYRINGE | INTRAVENOUS | Status: DC | PRN
  Administered 2018-08-06: 5 mg via INTRAVENOUS
  Administered 2018-08-06: 10 mg via INTRAVENOUS

## 2018-08-06 MED ORDER — FENTANYL CITRATE (PF) 100 MCG/2ML IJ SOLN
INTRAMUSCULAR | Status: AC
  Filled 2018-08-06: qty 2

## 2018-08-06 MED ORDER — DEXAMETHASONE SODIUM PHOSPHATE 10 MG/ML IJ SOLN
INTRAMUSCULAR | Status: DC | PRN
  Administered 2018-08-06: 10 mg via INTRAVENOUS

## 2018-08-06 MED ORDER — DEXAMETHASONE SODIUM PHOSPHATE 10 MG/ML IJ SOLN
INTRAMUSCULAR | Status: AC
  Filled 2018-08-06: qty 1

## 2018-08-06 MED ORDER — GABAPENTIN 300 MG PO CAPS
300.0000 mg | ORAL_CAPSULE | ORAL | Status: AC
  Administered 2018-08-06: 300 mg via ORAL

## 2018-08-06 MED ORDER — LACTATED RINGERS IV SOLN
INTRAVENOUS | Status: DC
  Administered 2018-08-06 (×2): via INTRAVENOUS

## 2018-08-06 MED ORDER — OXYCODONE HCL 5 MG PO TABS: 5.0000 mg | ORAL_TABLET | Freq: Four times a day (QID) | ORAL | 0 refills | Status: AC | PRN

## 2018-08-06 MED ORDER — OXYCODONE HCL 5 MG PO TABS: 5.0000 mg | ORAL_TABLET | Freq: Once | ORAL | Status: DC | PRN

## 2018-08-06 MED ORDER — BUPIVACAINE HCL (PF) 0.5 % IJ SOLN
INTRAMUSCULAR | Status: AC
  Filled 2018-08-06: qty 30

## 2018-08-06 MED ORDER — BUPIVACAINE-EPINEPHRINE (PF) 0.25% -1:200000 IJ SOLN
INTRAMUSCULAR | Status: DC | PRN
  Administered 2018-08-06: 10 mL

## 2018-08-06 MED ORDER — FENTANYL CITRATE (PF) 100 MCG/2ML IJ SOLN: 25.0000 ug | INTRAMUSCULAR | Status: DC | PRN

## 2018-08-06 MED ORDER — OXYCODONE HCL 5 MG/5ML PO SOLN: 5.0000 mg | Freq: Once | ORAL | Status: DC | PRN

## 2018-08-06 MED ORDER — MIDAZOLAM HCL 2 MG/2ML IJ SOLN
1.0000 mg | INTRAMUSCULAR | Status: DC | PRN
  Administered 2018-08-06: 2 mg via INTRAVENOUS

## 2018-08-06 SURGICAL SUPPLY — 48 items
ADH SKN CLS APL DERMABOND .7 (GAUZE/BANDAGES/DRESSINGS) ×1
BLADE CLIPPER SURG (BLADE) ×3 IMPLANT
BLADE HEX COATED 2.75 (ELECTRODE) ×3 IMPLANT
BLADE SURG 15 STRL LF DISP TIS (BLADE) ×1 IMPLANT
BLADE SURG 15 STRL SS (BLADE) ×3
CANISTER SUCTION 1200CC (MISCELLANEOUS) IMPLANT
CHLORAPREP W/TINT 26ML (MISCELLANEOUS) ×3 IMPLANT
COVER BACK TABLE 60X90IN (DRAPES) ×3 IMPLANT
COVER MAYO STAND STRL (DRAPES) ×3 IMPLANT
COVER WAND RF STERILE (DRAPES) IMPLANT
DECANTER SPIKE VIAL GLASS SM (MISCELLANEOUS) IMPLANT
DERMABOND ADVANCED (GAUZE/BANDAGES/DRESSINGS) ×2
DERMABOND ADVANCED .7 DNX12 (GAUZE/BANDAGES/DRESSINGS) ×1 IMPLANT
DRAIN PENROSE 1/2X12 LTX STRL (WOUND CARE) ×3 IMPLANT
DRAPE LAPAROTOMY 100X72 PEDS (DRAPES) ×3 IMPLANT
DRAPE UTILITY XL STRL (DRAPES) ×3 IMPLANT
ELECT REM PT RETURN 9FT ADLT (ELECTROSURGICAL) ×3
ELECTRODE REM PT RTRN 9FT ADLT (ELECTROSURGICAL) ×1 IMPLANT
GLOVE BIOGEL PI IND STRL 7.0 (GLOVE) IMPLANT
GLOVE BIOGEL PI INDICATOR 7.0 (GLOVE) ×4
GLOVE ECLIPSE 6.5 STRL STRAW (GLOVE) ×2 IMPLANT
GLOVE SURG SIGNA 7.5 PF LTX (GLOVE) ×3 IMPLANT
GOWN STRL REUS W/ TWL LRG LVL3 (GOWN DISPOSABLE) ×1 IMPLANT
GOWN STRL REUS W/ TWL XL LVL3 (GOWN DISPOSABLE) ×1 IMPLANT
GOWN STRL REUS W/TWL LRG LVL3 (GOWN DISPOSABLE) ×3
GOWN STRL REUS W/TWL XL LVL3 (GOWN DISPOSABLE) ×3
MESH PARIETEX PROGRIP RIGHT (Mesh General) ×2 IMPLANT
NDL HYPO 25X1 1.5 SAFETY (NEEDLE) ×1 IMPLANT
NEEDLE HYPO 25X1 1.5 SAFETY (NEEDLE) ×3 IMPLANT
NS IRRIG 1000ML POUR BTL (IV SOLUTION) ×2 IMPLANT
PACK BASIN DAY SURGERY FS (CUSTOM PROCEDURE TRAY) ×3 IMPLANT
PENCIL BUTTON HOLSTER BLD 10FT (ELECTRODE) ×3 IMPLANT
SLEEVE SCD COMPRESS KNEE MED (MISCELLANEOUS) ×3 IMPLANT
SPONGE INTESTINAL PEANUT (DISPOSABLE) IMPLANT
SPONGE LAP 4X18 RFD (DISPOSABLE) ×3 IMPLANT
SUT MNCRL AB 4-0 PS2 18 (SUTURE) ×3 IMPLANT
SUT SILK 2 0 SH (SUTURE) IMPLANT
SUT VIC AB 2-0 CT1 27 (SUTURE) ×6
SUT VIC AB 2-0 CT1 TAPERPNT 27 (SUTURE) ×2 IMPLANT
SUT VIC AB 3-0 CT1 27 (SUTURE) ×3
SUT VIC AB 3-0 CT1 27XBRD (SUTURE) ×1 IMPLANT
SYR BULB 3OZ (MISCELLANEOUS) IMPLANT
SYR CONTROL 10ML LL (SYRINGE) ×3 IMPLANT
TOWEL GREEN STERILE FF (TOWEL DISPOSABLE) ×3 IMPLANT
TOWEL OR NON WOVEN STRL DISP B (DISPOSABLE) ×1 IMPLANT
TUBE CONNECTING 20'X1/4 (TUBING)
TUBE CONNECTING 20X1/4 (TUBING) IMPLANT
YANKAUER SUCT BULB TIP NO VENT (SUCTIONS) IMPLANT

## 2018-08-06 NOTE — Interval H&P Note (Signed)
History and Physical Interval Note:no change in H and P  08/06/2018 1:02 PM  James Lang  has presented today for surgery, with the diagnosis of RECURRENT RIGHT INGUINAL HERNIA  The various methods of treatment have been discussed with the patient and family. After consideration of risks, benefits and other options for treatment, the patient has consented to  Procedure(s) with comments: RIGHT INGUINAL HERNIA REPAIR WITH MESH (Right) - GENERAL AND TAP BLOCK INSERTION OF MESH (Right) - GENERAL AND TAP BLOCK as a surgical intervention .  The patient's history has been reviewed, patient examined, no change in status, stable for surgery.  I have reviewed the patient's chart and labs.  Questions were answered to the patient's satisfaction.     Kasondra Junod A

## 2018-08-06 NOTE — Anesthesia Preprocedure Evaluation (Addendum)
Anesthesia Evaluation  Patient identified by MRN, date of birth, ID band Patient awake    Reviewed: Allergy & Precautions, NPO status , Patient's Chart, lab work & pertinent test results  History of Anesthesia Complications Negative for: history of anesthetic complications  Airway Mallampati: II  TM Distance: >3 FB Neck ROM: Full    Dental  (+) Dental Advisory Given, Loose, Missing,    Pulmonary former smoker,    breath sounds clear to auscultation       Cardiovascular hypertension, Pt. on medications  Rhythm:Regular Rate:Bradycardia   '18 TTE - Mild concentric LVH. EF 60% to 65%. Grade 1 diastolic dysfunction. Trivial AI, mild MR.  '18 Carotid US - 1-39% b/l ICAS    Neuro/Psych PSYCHIATRIC DISORDERS Anxiety TIA   GI/Hepatic negative GI ROS, Neg liver ROS,   Endo/Other   Obesity   Renal/GU negative Renal ROS    Impotence Prostate cancer     Musculoskeletal negative musculoskeletal ROS (+)   Abdominal   Peds  Hematology negative hematology ROS (+)   Anesthesia Other Findings   Reproductive/Obstetrics                            Anesthesia Physical Anesthesia Plan  ASA: III  Anesthesia Plan: General   Post-op Pain Management:  Regional for Post-op pain   Induction: Intravenous  PONV Risk Score and Plan: 2 and Treatment may vary due to age or medical condition, Ondansetron and Dexamethasone  Airway Management Planned: LMA  Additional Equipment: None  Intra-op Plan:   Post-operative Plan: Extubation in OR  Informed Consent: I have reviewed the patients History and Physical, chart, labs and discussed the procedure including the risks, benefits and alternatives for the proposed anesthesia with the patient or authorized representative who has indicated his/her understanding and acceptance.   Dental advisory given  Plan Discussed with: CRNA and  Anesthesiologist  Anesthesia Plan Comments:        Anesthesia Quick Evaluation

## 2018-08-06 NOTE — Anesthesia Postprocedure Evaluation (Signed)
Anesthesia Post Note  Patient: BUBBA VANBENSCHOTEN  Procedure(s) Performed: RIGHT INGUINAL HERNIA REPAIR WITH MESH (Right Groin) INSERTION OF MESH (Right Groin)     Patient location during evaluation: PACU Anesthesia Type: General Level of consciousness: awake and alert Pain management: pain level controlled Vital Signs Assessment: post-procedure vital signs reviewed and stable Respiratory status: spontaneous breathing, nonlabored ventilation and respiratory function stable Cardiovascular status: blood pressure returned to baseline and stable Postop Assessment: no apparent nausea or vomiting Anesthetic complications: no    Last Vitals:  Vitals:   08/06/18 1500 08/06/18 1515  BP: (!) 142/76 135/85  Pulse: (!) 48 (!) 51  Resp: 13 16  Temp:  36.6 C  SpO2: 98% 97%    Last Pain:  Vitals:   08/06/18 1515  TempSrc:   PainSc: Dalhart Brae Gartman

## 2018-08-06 NOTE — Anesthesia Procedure Notes (Signed)
Procedure Name: LMA Insertion Date/Time: 08/06/2018 1:41 PM Performed by: Genelle Bal, CRNA Pre-anesthesia Checklist: Patient identified, Emergency Drugs available, Suction available and Patient being monitored Patient Re-evaluated:Patient Re-evaluated prior to induction Oxygen Delivery Method: Circle system utilized Preoxygenation: Pre-oxygenation with 100% oxygen Induction Type: IV induction Ventilation: Mask ventilation without difficulty LMA: LMA inserted LMA Size: 5.0 Number of attempts: 1 Airway Equipment and Method: Bite block Placement Confirmation: positive ETCO2 Tube secured with: Tape Dental Injury: Teeth and Oropharynx as per pre-operative assessment

## 2018-08-06 NOTE — Transfer of Care (Signed)
Immediate Anesthesia Transfer of Care Note  Patient: James Lang  Procedure(s) Performed: RIGHT INGUINAL HERNIA REPAIR WITH MESH (Right Groin) INSERTION OF MESH (Right Groin)  Patient Location: PACU  Anesthesia Type:GA combined with regional for post-op pain  Level of Consciousness: awake, alert  and oriented  Airway & Oxygen Therapy: Patient Spontanous Breathing and Patient connected to face mask oxygen  Post-op Assessment: Report given to RN and Post -op Vital signs reviewed and stable  Post vital signs: Reviewed and stable  Last Vitals:  Vitals Value Taken Time  BP 139/74 08/06/2018  2:30 PM  Temp    Pulse 62 08/06/2018  2:32 PM  Resp 13 08/06/2018  2:32 PM  SpO2 100 % 08/06/2018  2:32 PM  Vitals shown include unvalidated device data.  Last Pain:  Vitals:   08/06/18 1213  TempSrc: Oral  PainSc: 5       Patients Stated Pain Goal: 2 (23/30/07 6226)  Complications: No apparent anesthesia complications

## 2018-08-06 NOTE — Op Note (Signed)
RIGHT INGUINAL HERNIA REPAIR WITH MESH, INSERTION OF MESH  Procedure Note  James Lang 08/06/2018   Pre-op Diagnosis: RECURRENT RIGHT INGUINAL HERNIA     Post-op Diagnosis: same  Procedure(s): RIGHT INGUINAL HERNIA REPAIR WITH MESH INSERTION OF MESH  Surgeon(s): Coralie Keens, MD  Anesthesia: General  Staff:  Circulator: Maurene Capes, RN Scrub Person: Lisbeth Ply, RN  Estimated Blood Loss: Minimal               Findings: The patient was found to have a direct right inguinal hernia.  Procedure: The patient was brought to the operating room and identified as the correct patient.  He was placed upon the operating table and general anesthesia was induced.  His abdomen was then prepped and draped in usual sterile fashion.  I anesthetized the skin with Marcaine along his previous scar in the right lower quadrant.  I made incision with a scalpel.  I took this down through Scarpa's fascia with electrocautery.  The previous placed mesh underneath the external oblique fascia was opened up along with the fascia.  The patient's previous indirect hernia repair was intact.  He developed a direct hernia and basically had almost no inguinal floor.  I reduced all contents and then brought a piece of pro-grip prolene mesh onto the field.  I placed against the pubic tubercle and then brought around the cord structures re-creating before of the inguinal canal.  I then sutured this in place with interrupted 2-0 Vicryl sutures.  I then closed the previous externally fascia mesh over the top of this with a running 2-0 Vicryl suture as well.  Scarpa's fascia was then closed interrupted 3-0 Vicryl sutures and the skin was closed with running 4-0 Monocryl.  Dermabond was then applied.  The patient tolerated the procedure well.  All the counts were correct at the end the procedure.  The patient was then extubated in the operating room and taken in a stable condition to recovery room.           Carollee Nussbaumer A   Date: 08/06/2018  Time: 2:24 PM

## 2018-08-06 NOTE — Anesthesia Procedure Notes (Signed)
Anesthesia Regional Block: TAP block   Pre-Anesthetic Checklist: ,, timeout performed, Correct Patient, Correct Site, Correct Laterality, Correct Procedure, Correct Position, site marked, Risks and benefits discussed,  Surgical consent,  Pre-op evaluation,  At surgeon's request and post-op pain management  Laterality: Right  Prep: chloraprep       Needles:  Injection technique: Single-shot  Needle Type: Echogenic Needle     Needle Length: 10cm  Needle Gauge: 21     Additional Needles:   Narrative:  Start time: 08/06/2018 12:48 PM End time: 08/06/2018 12:53 PM Injection made incrementally with aspirations every 5 mL.  Performed by: Personally  Anesthesiologist: Audry Pili, MD  Additional Notes: No pain on injection. No increased resistance to injection. Injection made in 5cc increments. Good needle visualization. Patient tolerated the procedure well.

## 2018-08-06 NOTE — Progress Notes (Signed)
Assisted Dr. Brock with right, ultrasound guided, transabdominal plane block. Side rails up, monitors on throughout procedure. See vital signs in flow sheet. Tolerated Procedure well. 

## 2018-08-06 NOTE — Discharge Instructions (Signed)
[facility] _______[facility]  UMBILICAL OR INGUINAL HERNIA REPAIR: POST OP INSTRUCTIONS  Always review your discharge instruction sheet given to you by the facility where your surgery was performed. IF YOU HAVE DISABILITY OR FAMILY LEAVE FORMS, YOU MUST BRING THEM TO THE OFFICE FOR PROCESSING.   DO NOT GIVE THEM TO YOUR DOCTOR.  1. A  prescription for pain medication may be given to you upon discharge.  Take your pain medication as prescribed, if needed.  If narcotic pain medicine is not needed, then you may take acetaminophen (Tylenol) or ibuprofen (Advil) as needed. 2. Take your usually prescribed medications unless otherwise directed. If you need a refill on your pain medication, please contact your pharmacy.  They will contact our office to request authorization. Prescriptions will not be filled after 5 pm or on week-ends. 3. You should follow a light diet the first 24 hours after arrival home, such as soup and crackers, etc.  Be sure to include lots of fluids daily.  Resume your normal diet the day after surgery. 4.Most patients will experience some swelling and bruising around the umbilicus or in the groin and scrotum.  Ice packs and reclining will help.  Swelling and bruising can take several days to resolve.  6. It is common to experience some constipation if taking pain medication after surgery.  Increasing fluid intake and taking a stool softener (such as Colace) will usually help or prevent this problem from occurring.  A mild laxative (Milk of Magnesia or Miralax) should be taken according to package directions if there are no bowel movements after 48 hours. 7. Unless discharge instructions indicate otherwise, you may remove your bandages 24-48 hours after surgery, and you may shower at that time.  You may have steri-strips (small skin tapes) in place directly over the incision.  These strips should be left on the skin for 7-10 days.  If your surgeon used skin glue on the  incision, you may shower in 24 hours.  The glue will flake off over the next 2-3 weeks.  Any sutures or staples will be removed at the office during your follow-up visit. 8. ACTIVITIES:  You may resume regular (light) daily activities beginning the next day--such as daily self-care, walking, climbing stairs--gradually increasing activities as tolerated.  You may have sexual intercourse when it is comfortable.  Refrain from any heavy lifting or straining until approved by your doctor.  a.You may drive when you are no longer taking prescription pain medication, you can comfortably wear a seatbelt, and you can safely maneuver your car and apply brakes. b.RETURN TO WORK:   _____________________________________________  9.You should see your doctor in the office for a follow-up appointment approximately 2-3 weeks after your surgery.  Make sure that you call for this appointment within a day or two after you arrive home to insure a convenient appointment time. 10.OTHER INSTRUCTIONS: __NO LIFTING MORE THAN 15 POUNDS FOR 4 WEEKS ICE PACK, TYLENOL, IBUPROFEN ALSO FOR PAIN OK TO SHOWER STARTING TOMORROW_______________________    _____________________________________  WHEN TO CALL YOUR DOCTOR: 1. Fever over 101.0 2. Inability to urinate 3. Nausea and/or vomiting 4. Extreme swelling or bruising 5. Continued bleeding from incision. 6. Increased pain, redness, or drainage from the incision  The clinic staff is available to answer your questions during regular business hours.  Please dont hesitate to call and ask to speak to one of the nurses for clinical concerns.  If you have a medical emergency, go to the nearest emergency  room or call 911.  A surgeon from [facility] is always on call at the hospital   [address] [place], [place], [place]  [address] ?  [address], [place], [place]   [address] [phone] ? [phone] ? FAX [phone] Web site:  [email or web address]    No Tylenol until 6:30pm!    Post Anesthesia Home Care Instructions  Activity: Get plenty of rest for the remainder of the day. A responsible individual must stay with you for 24 hours following the procedure.  For the next 24 hours, DO NOT: -Drive a car -Paediatric nurse -Drink alcoholic beverages -Take any medication unless instructed by your physician -Make any legal decisions or sign important papers.  Meals: Start with liquid foods such as gelatin or soup. Progress to regular foods as tolerated. Avoid greasy, spicy, heavy foods. If nausea and/or vomiting occur, drink only clear liquids until the nausea and/or vomiting subsides. Call your physician if vomiting continues.  Special Instructions/Symptoms: Your throat may feel dry or sore from the anesthesia or the breathing tube placed in your throat during surgery. If this causes discomfort, gargle with warm salt water. The discomfort should disappear within 24 hours.  If you had a scopolamine patch placed behind your ear for the management of post- operative nausea and/or vomiting:  1. The medication in the patch is effective for 72 hours, after which it should be removed.  Wrap patch in a tissue and discard in the trash. Wash hands thoroughly with soap and water. 2. You may remove the patch earlier than 72 hours if you experience unpleasant side effects which may include dry mouth, dizziness or visual disturbances. 3. Avoid touching the patch. Wash your hands with soap and water after contact with the patch.     Information for Discharge Teaching: EXPAREL (bupivacaine liposome injectable suspension)   Your surgeon or anesthesiologist gave you EXPAREL(bupivacaine) to help control your pain after surgery.   EXPAREL is a local anesthetic that provides pain relief by numbing the tissue around the surgical site.  EXPAREL is designed to release pain medication over time and can control pain for  up to 72 hours.  Depending on how you respond to EXPAREL, you may require less pain medication during your recovery.  Possible side effects:  Temporary loss of sensation or ability to move in the area where bupivacaine was injected.  Nausea, vomiting, constipation  Rarely, numbness and tingling in your mouth or lips, lightheadedness, or anxiety may occur.  Call your doctor right away if you think you may be experiencing any of these sensations, or if you have other questions regarding possible side effects.  Follow all other discharge instructions given to you by your surgeon or nurse. Eat a healthy diet and drink plenty of water or other fluids.  If you return to the hospital for any reason within 96 hours following the administration of EXPAREL, it is important for health care providers to know that you have received this anesthetic. A teal colored band has been placed on your arm with the date, time and amount of EXPAREL you have received in order to alert and inform your health care providers. Please leave this armband in place for the full 96 hours following administration, and then you may remove the band.

## 2018-08-07 ENCOUNTER — Encounter (HOSPITAL_BASED_OUTPATIENT_CLINIC_OR_DEPARTMENT_OTHER): Payer: Self-pay | Admitting: Surgery

## 2018-08-14 IMAGING — DX DG ORBITS FOR FOREIGN BODY
2 series · 2 of 2 positions shown · non-contrast
Comparison: None.

CLINICAL DATA: Metal working/exposure; clearance prior to MRI

EXAM:
ORBITS FOR FOREIGN BODY - 2 VIEW

[orbits waters (1 of 2)]
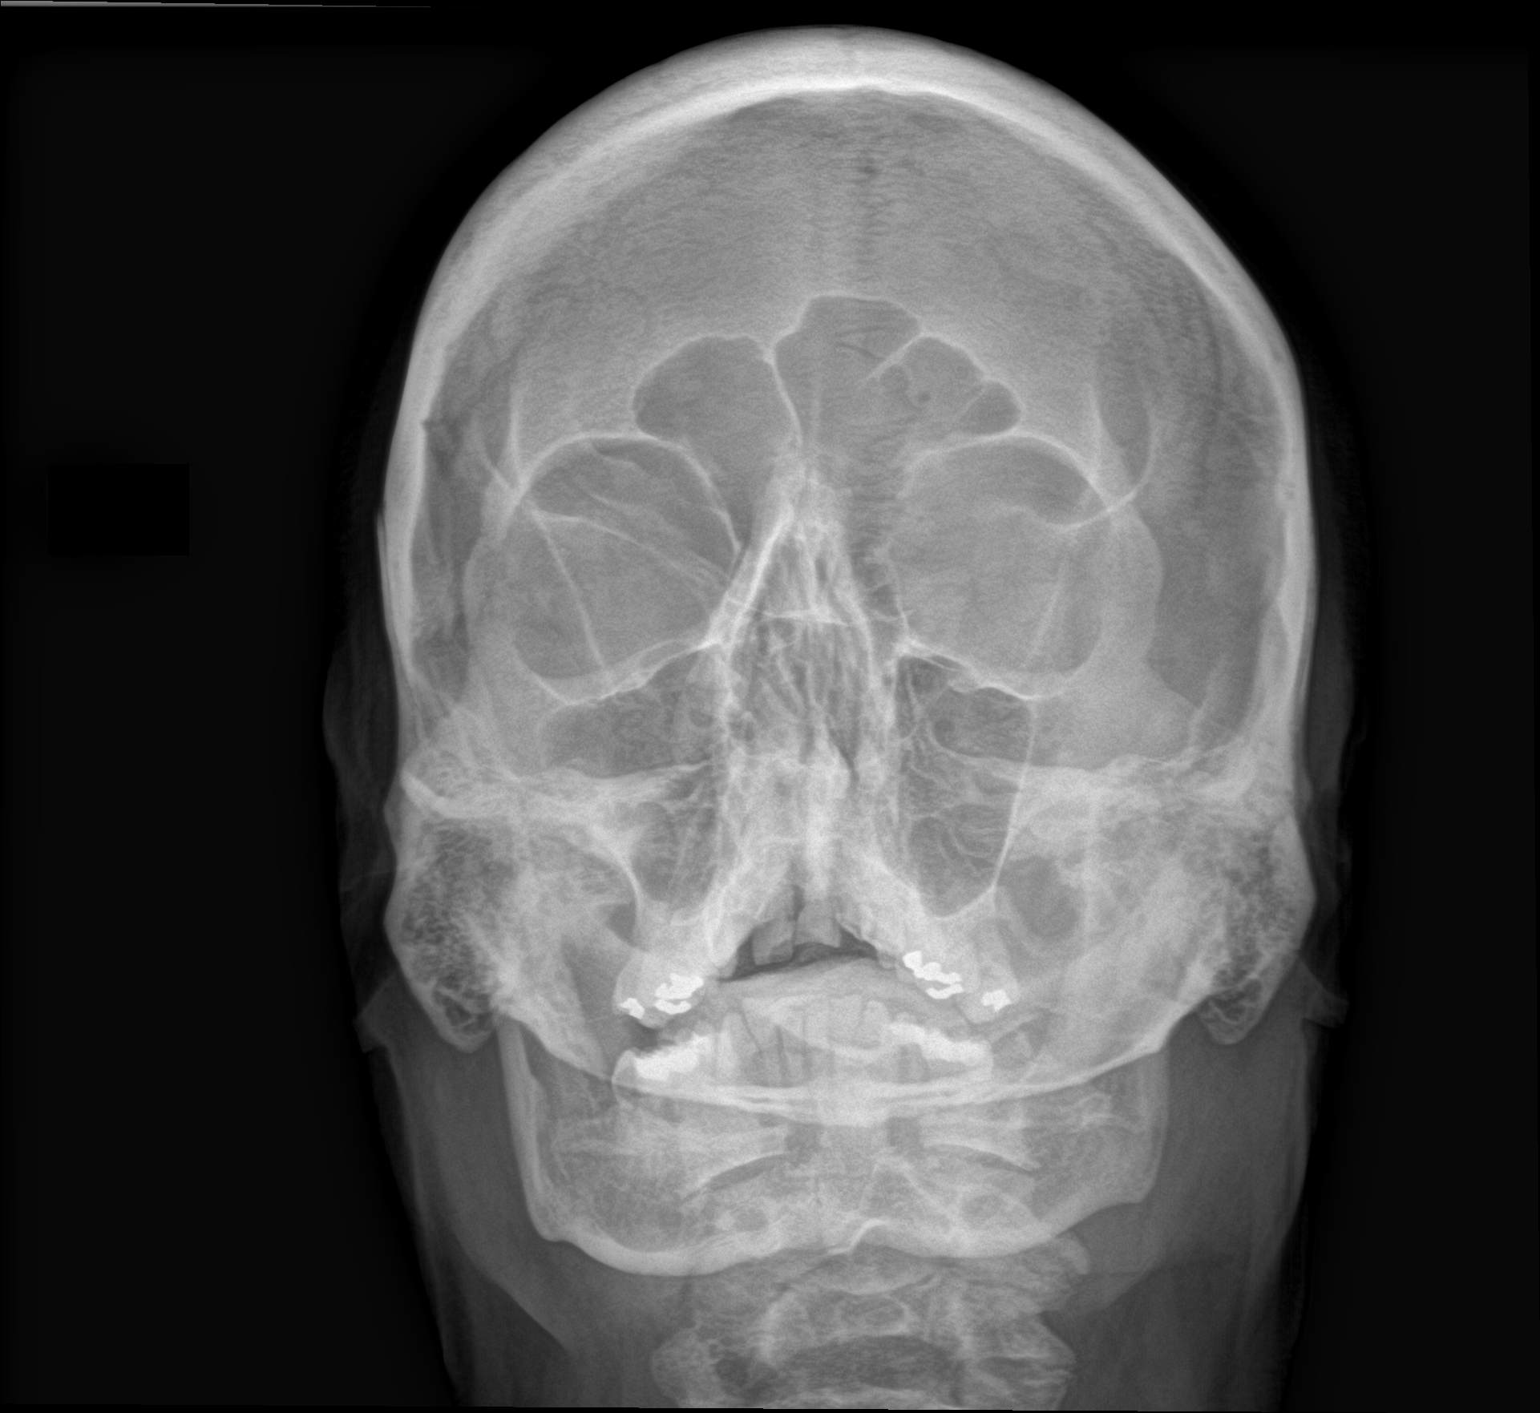

[orbits waters (2 of 2)]
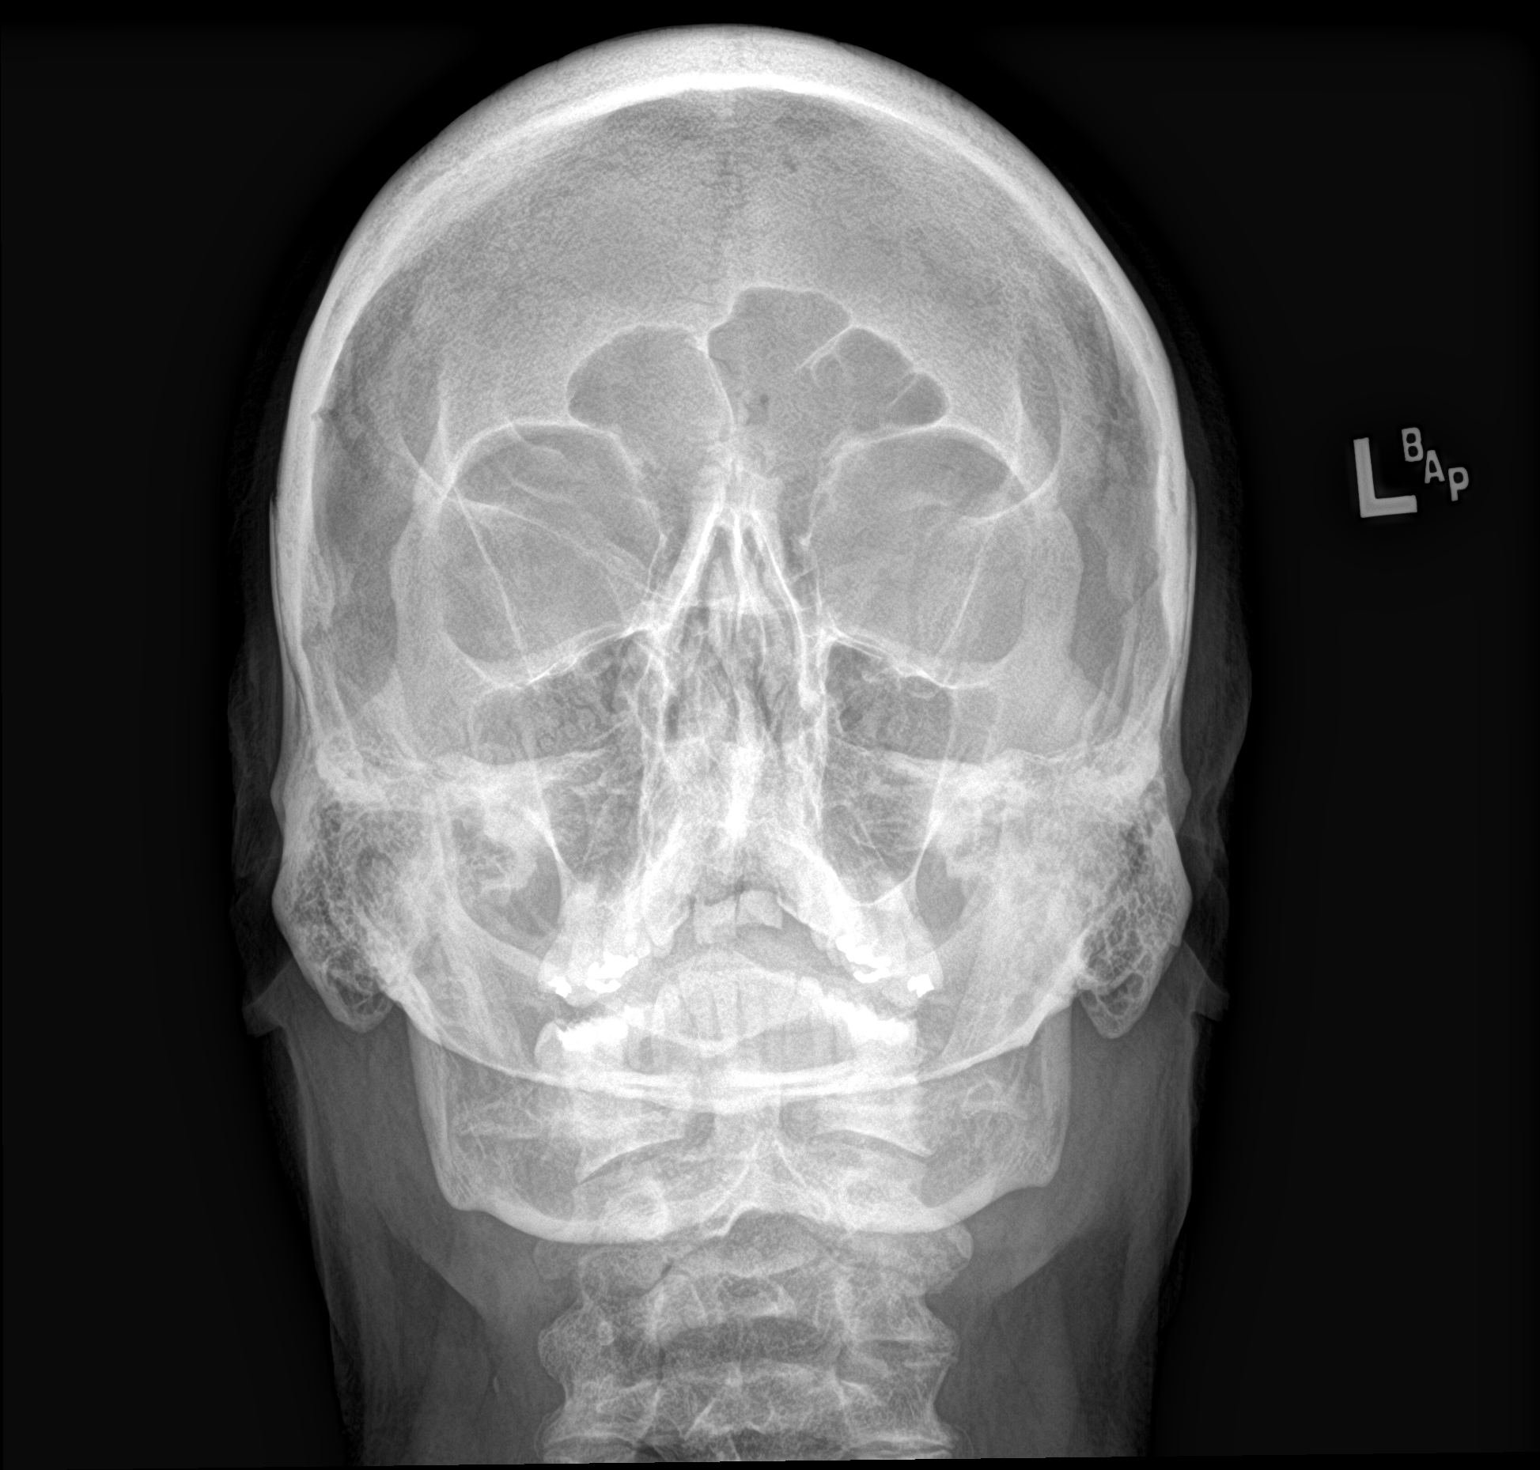

[2 of 2 positions shown; findings below may reference images not displayed]

FINDINGS: There is no evidence of metallic foreign body within the orbits. No
significant bone abnormality identified.
IMPRESSION: No evidence of metallic foreign body within the orbits.

## 2018-08-14 IMAGING — MR MR HEAD W/O CM
9 of 11 series · 31 of 48 positions shown · non-contrast
Comparison: Prior CT from earlier same day.

CLINICAL DATA: Initial evaluation for transient speech disturbance.

EXAM:
MRI HEAD WITHOUT CONTRAST
MRA HEAD WITHOUT CONTRAST
TECHNIQUE: Multiplanar, multiecho pulse sequences of the brain and surrounding
structures were obtained without intravenous contrast. Angiographic
images of the head were obtained using MRA technique without
contrast.

[Series 3: DWI · axial · 3.0mm · 0.94mm/px · z∈[-25,+112]mm · 7 of 94 slices shown (1 of 2)]
[im 1/94]
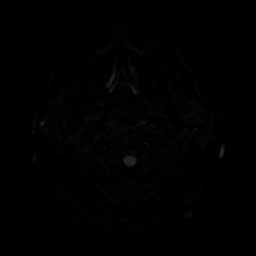
[im 16/94]
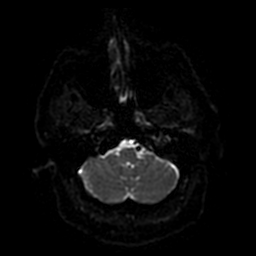
[im 32/94]
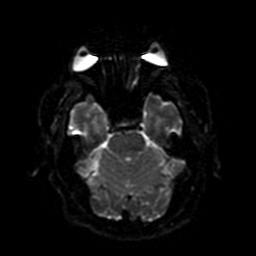
[im 47/94]
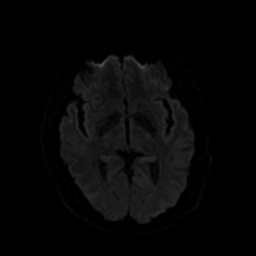
[im 63/94]
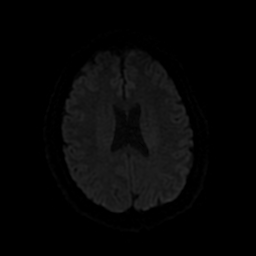
[im 78/94]
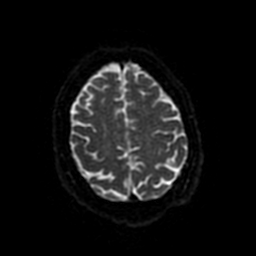
[im 94/94]
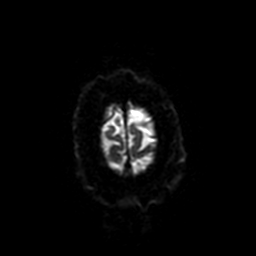

[Series 4: ax (id) 2 · axial · 1.0mm · 0.43mm/px · z∈[-24,+37]mm · 6 of 184 slices shown]
[im 1/184]
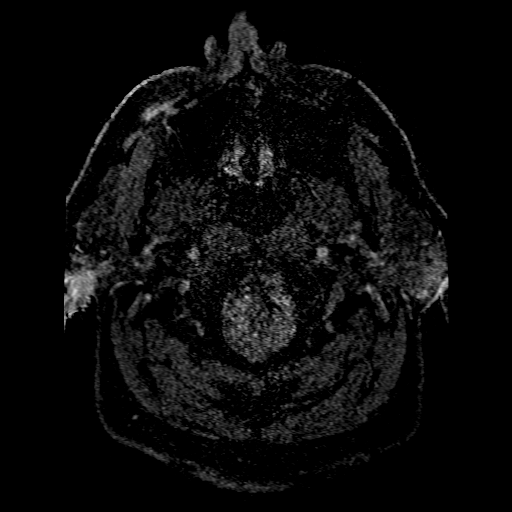
[im 31/184]
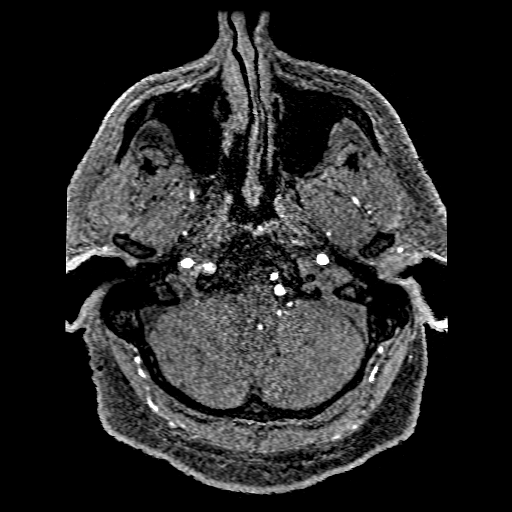
[im 62/184]
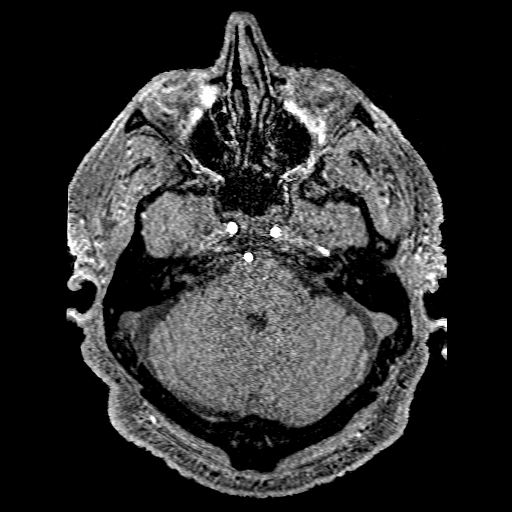
[im 77/184]
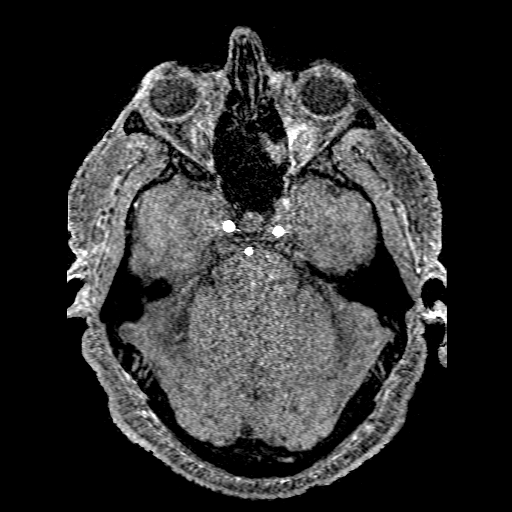
[im 107/184]
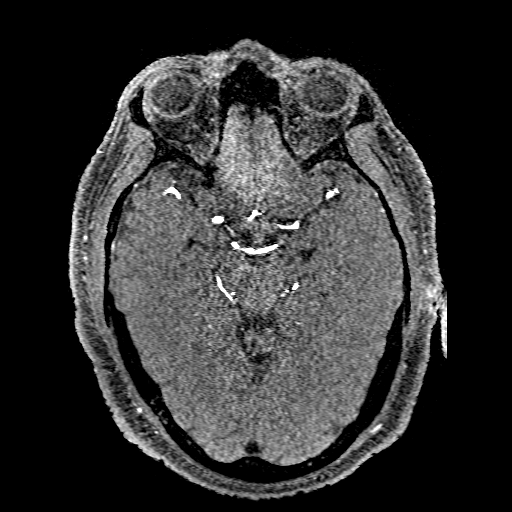
[im 123/184]
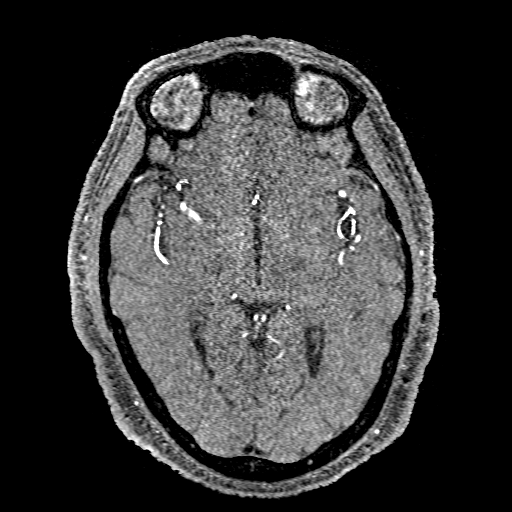

[Series 5: DWI · coronal · 4.0mm · 0.94mm/px · 5 of 66 slices shown (2 of 2)]
[im 1/66]
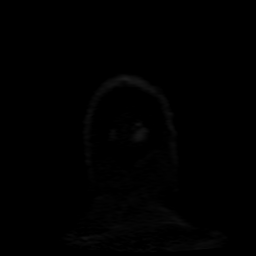
[im 17/66]
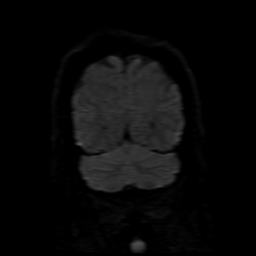
[im 33/66]
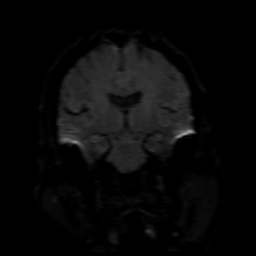
[im 49/66]
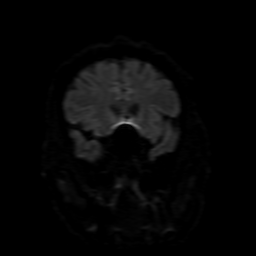
[im 66/66]
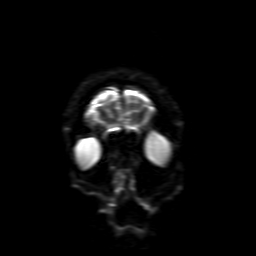

[Series 6: T2 · axial · 5.0mm · 0.47mm/px · z∈[-25,+112]mm · 2 of 24 slices shown (1 of 2)]
[im 1/24]
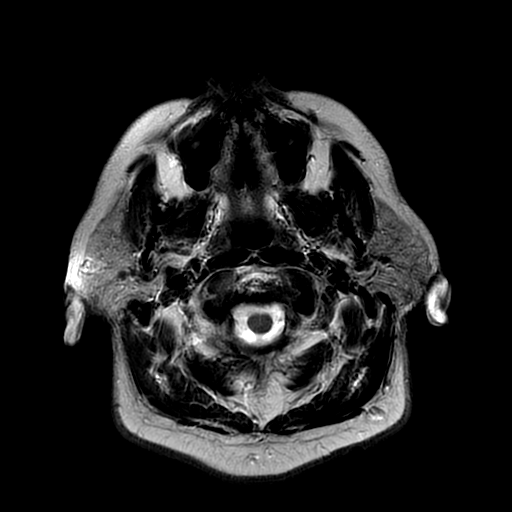
[im 24/24]
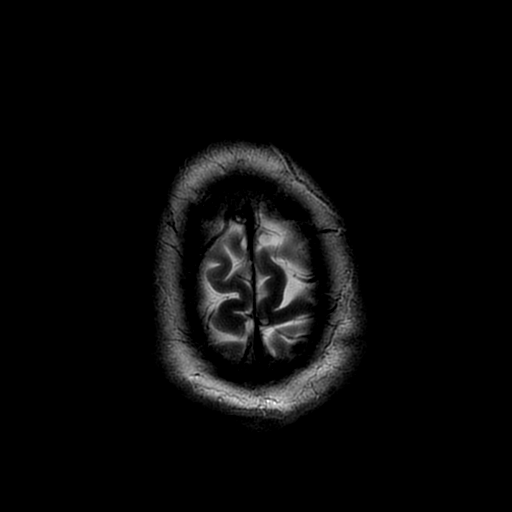

[Series 7: FLAIR · axial · 5.0mm · 0.94mm/px · z∈[-25,+112]mm · 2 of 24 slices shown (1 of 2)]
[im 1/24]
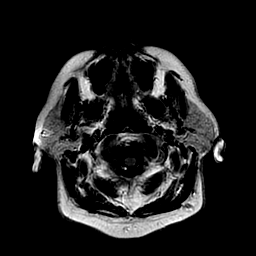
[im 24/24]
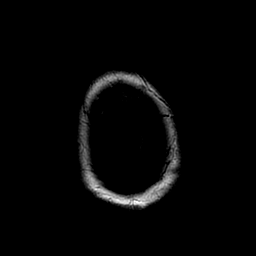

[Series 10: FLAIR · sagittal · 5.0mm · 0.47mm/px · 2 of 23 slices shown (2 of 2)]
[im 1/23]
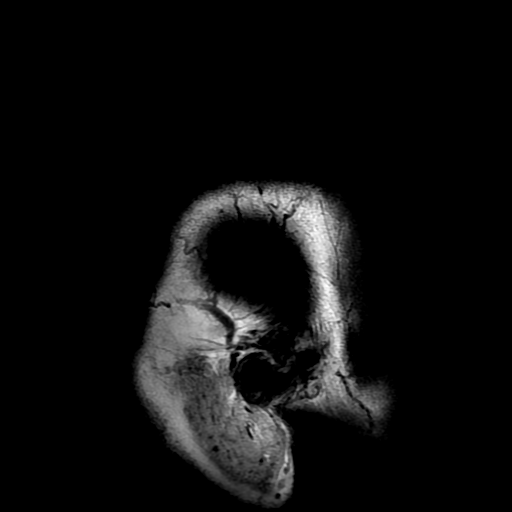
[im 23/23]
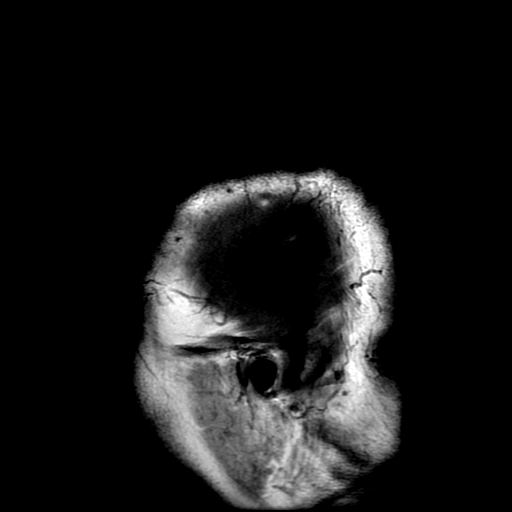

[Series 11: T2 · coronal · 5.0mm · 0.43mm/px · 2 of 28 slices shown (2 of 2)]
[im 1/28]
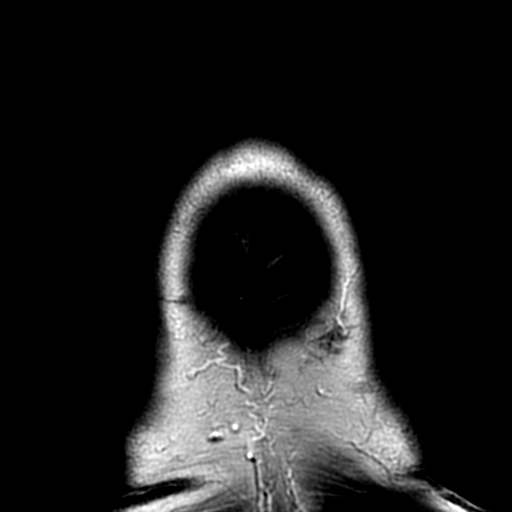
[im 28/28]
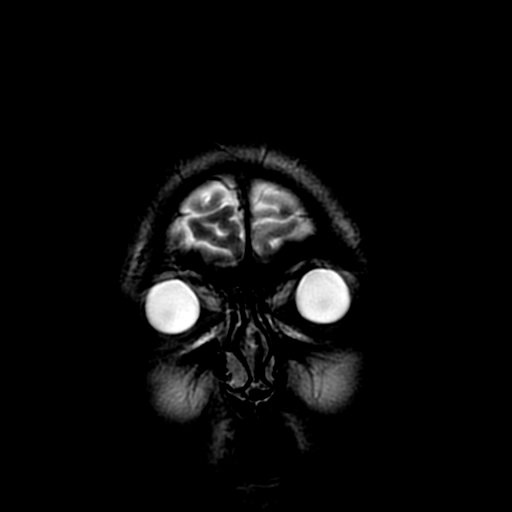

[Series 350: ADC · axial · 3.0mm · 0.94mm/px · z∈[-25,+112]mm · 3 of 45 slices shown (1 of 2)]
[im 1/45]
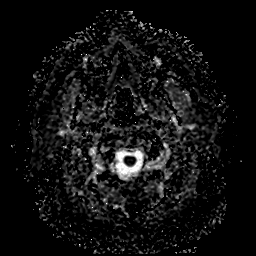
[im 23/45]
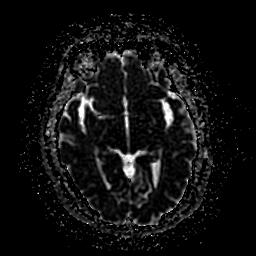
[im 45/45]
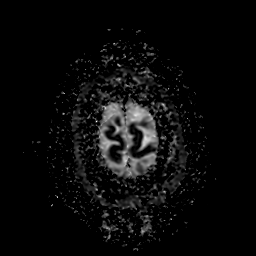

[Series 550: ADC · coronal · 4.0mm · 0.94mm/px · 2 of 33 slices shown (2 of 2)]
[im 1/33]
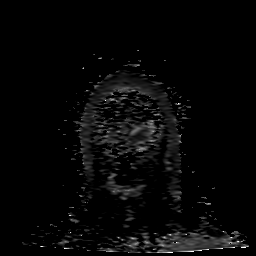
[im 33/33]
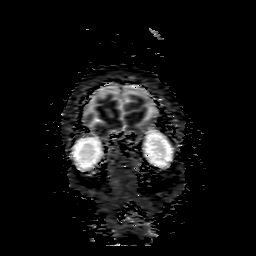

[31 of 48 positions shown; findings below may reference images not displayed]

FINDINGS: MRI HEAD FINDINGS

Brain: Cerebral volume within normal limits. Minimal patchy T2/FLAIR
hyperintensity present within the periventricular and deep white
matter both cerebral hemispheres, nonspecific, but most likely
related chronic small vessel ischemic disease, felt to be within
normal limits for age.

No abnormal foci of restricted diffusion to suggest acute or
subacute ischemia. Gray-white matter differentiation maintained. No
encephalomalacia to suggest chronic infarction. No evidence for
acute or chronic intracranial hemorrhage.

No mass lesion, midline shift or mass effect. Ventricles normal size
without evidence for hydrocephalus. No extra-axial fluid collection.
Major dural sinuses are grossly patent.

Pituitary gland suprasellar region within normal limits. Midline
structures intact and normal.

Vascular: Major intracranial vascular flow voids are maintained.

Skull and upper cervical spine: Craniocervical junction normal.
Visualized upper cervical spine unremarkable. Bone marrow signal
intensity within normal limits. No scalp soft tissue abnormality.

Sinuses/Orbits: Globes and orbital soft tissues within normal
limits. Mild scattered mucosal thickening within the ethmoidal air
cells. Paranasal sinuses are otherwise clear. No mastoid effusion.
Inner ear structures normal.

MRA HEAD FINDINGS

ANTERIOR CIRCULATION:

Distal cervical segments of the internal carotid arteries are patent
with antegrade flow. Petrous, cavernous, and supraclinoid segments
patent without flow-limiting stenosis. ICA termini widely patent. A1
segments patent. Anterior communicating artery normal. Anterior
cerebral arteries patent to their distal aspects. M1 segments patent
without stenosis or occlusion. No proximal M2 occlusion. Distal MCA
branches well opacified and symmetric.

POSTERIOR CIRCULATION:

Vertebral arteries patent to the vertebrobasilar junction. Left
vertebral artery dominant. Posterior inferior cerebral arteries
patent proximally. Basilar artery widely patent. Superior cerebellar
is patent bilaterally. Both of the posterior cerebral arteries
supplied mainly via the basilar and are widely patent to their
distal aspects.

No aneurysm or vascular malformation.
IMPRESSION: 1. Normal brain MRI. No acute intracranial infarct or other process
identified.
2. Normal intracranial MRA.

## 2019-10-25 ENCOUNTER — Ambulatory Visit
Admission: RE | Admit: 2019-10-25 | Discharge: 2019-10-25 | Disposition: A | Discharge status: Home / Self Care | Payer: Medicare HMO | Source: Ambulatory Visit | Attending: Cardiovascular Disease | Admitting: Cardiovascular Disease

## 2019-10-25 ENCOUNTER — Other Ambulatory Visit: Payer: Self-pay | Admitting: Cardiovascular Disease

## 2019-10-25 DIAGNOSIS — R053 Chronic cough: Secondary | ICD-10-CM

## 2019-10-25 DIAGNOSIS — R05 Cough: Secondary | ICD-10-CM
